# Patient Record
Sex: Female | Born: 1970 | Race: White | Hispanic: No | Smoking: Current every day smoker
Health system: Southern US, Community
[De-identification: ages and names within clinical notes are randomized; demographics above are authoritative.]

## PROBLEM LIST (undated history)

## (undated) DIAGNOSIS — B029 Zoster without complications: Secondary | ICD-10-CM

## (undated) DIAGNOSIS — R238 Other skin changes: Secondary | ICD-10-CM

## (undated) DIAGNOSIS — F32A Depression, unspecified: Secondary | ICD-10-CM

## (undated) DIAGNOSIS — F329 Major depressive disorder, single episode, unspecified: Secondary | ICD-10-CM

## (undated) DIAGNOSIS — M25559 Pain in unspecified hip: Secondary | ICD-10-CM

## (undated) DIAGNOSIS — F419 Anxiety disorder, unspecified: Secondary | ICD-10-CM

## (undated) DIAGNOSIS — E538 Deficiency of other specified B group vitamins: Secondary | ICD-10-CM

## (undated) DIAGNOSIS — D751 Secondary polycythemia: Secondary | ICD-10-CM

## (undated) DIAGNOSIS — T7840XA Allergy, unspecified, initial encounter: Secondary | ICD-10-CM

## (undated) DIAGNOSIS — K219 Gastro-esophageal reflux disease without esophagitis: Secondary | ICD-10-CM

## (undated) DIAGNOSIS — F909 Attention-deficit hyperactivity disorder, unspecified type: Secondary | ICD-10-CM

## (undated) DIAGNOSIS — J45909 Unspecified asthma, uncomplicated: Secondary | ICD-10-CM

## (undated) DIAGNOSIS — R233 Spontaneous ecchymoses: Secondary | ICD-10-CM

## (undated) HISTORY — DX: Gastro-esophageal reflux disease without esophagitis: K21.9

## (undated) HISTORY — DX: Anxiety disorder, unspecified: F41.9

## (undated) HISTORY — DX: Pain in unspecified hip: M25.559

## (undated) HISTORY — DX: Zoster without complications: B02.9

## (undated) HISTORY — PX: COLONOSCOPY: SHX174

## (undated) HISTORY — DX: Unspecified asthma, uncomplicated: J45.909

## (undated) HISTORY — PX: TUBAL LIGATION: SHX77

## (undated) HISTORY — DX: Secondary polycythemia: D75.1

## (undated) HISTORY — DX: Spontaneous ecchymoses: R23.3

## (undated) HISTORY — DX: Major depressive disorder, single episode, unspecified: F32.9

## (undated) HISTORY — DX: Depression, unspecified: F32.A

## (undated) HISTORY — DX: Other skin changes: R23.8

## (undated) HISTORY — DX: Deficiency of other specified B group vitamins: E53.8

## (undated) HISTORY — DX: Allergy, unspecified, initial encounter: T78.40XA

## (undated) HISTORY — DX: Attention-deficit hyperactivity disorder, unspecified type: F90.9

---
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
## 2000-07-17 ENCOUNTER — Other Ambulatory Visit: Admission: RE | Admit: 2000-07-17 | Discharge: 2000-07-17 | Payer: Self-pay | Admitting: Gynecology

## 2001-07-19 ENCOUNTER — Other Ambulatory Visit: Admission: RE | Admit: 2001-07-19 | Discharge: 2001-07-19 | Payer: Self-pay | Admitting: Gynecology

## 2002-07-21 ENCOUNTER — Other Ambulatory Visit: Admission: RE | Admit: 2002-07-21 | Discharge: 2002-07-21 | Payer: Self-pay | Admitting: Gynecology

## 2003-07-22 ENCOUNTER — Other Ambulatory Visit: Admission: RE | Admit: 2003-07-22 | Discharge: 2003-07-22 | Payer: Self-pay | Admitting: Gynecology

## 2003-07-30 ENCOUNTER — Encounter: Admission: RE | Admit: 2003-07-30 | Discharge: 2003-07-30 | Payer: Self-pay | Admitting: Internal Medicine

## 2004-03-06 ENCOUNTER — Inpatient Hospital Stay (HOSPITAL_COMMUNITY): Admission: AD | Admit: 2004-03-06 | Discharge: 2004-03-06 | Payer: Self-pay | Admitting: Obstetrics & Gynecology

## 2004-08-18 ENCOUNTER — Other Ambulatory Visit: Admission: RE | Admit: 2004-08-18 | Discharge: 2004-08-18 | Payer: Self-pay | Admitting: Gynecology

## 2005-05-02 ENCOUNTER — Ambulatory Visit (HOSPITAL_COMMUNITY): Admission: RE | Admit: 2005-05-02 | Discharge: 2005-05-02 | Payer: Self-pay | Admitting: Obstetrics and Gynecology

## 2005-06-28 ENCOUNTER — Inpatient Hospital Stay (HOSPITAL_COMMUNITY): Admission: AD | Admit: 2005-06-28 | Discharge: 2005-07-01 | Payer: Self-pay | Admitting: Obstetrics and Gynecology

## 2005-07-02 ENCOUNTER — Encounter: Admission: RE | Admit: 2005-07-02 | Discharge: 2005-08-01 | Payer: Self-pay | Admitting: Obstetrics and Gynecology

## 2006-09-11 ENCOUNTER — Other Ambulatory Visit: Admission: RE | Admit: 2006-09-11 | Discharge: 2006-09-11 | Payer: Self-pay | Admitting: Gynecology

## 2007-05-24 ENCOUNTER — Encounter (INDEPENDENT_AMBULATORY_CARE_PROVIDER_SITE_OTHER): Payer: Self-pay | Admitting: Obstetrics and Gynecology

## 2007-05-24 ENCOUNTER — Inpatient Hospital Stay (HOSPITAL_COMMUNITY): Admission: RE | Admit: 2007-05-24 | Discharge: 2007-05-27 | Payer: Self-pay | Admitting: Obstetrics and Gynecology

## 2007-05-27 ENCOUNTER — Encounter: Admission: RE | Admit: 2007-05-27 | Discharge: 2007-06-13 | Payer: Self-pay | Admitting: Obstetrics and Gynecology

## 2007-07-12 ENCOUNTER — Encounter: Admission: RE | Admit: 2007-07-12 | Discharge: 2007-07-12 | Payer: Self-pay | Admitting: Obstetrics and Gynecology

## 2008-02-14 ENCOUNTER — Encounter: Admission: RE | Admit: 2008-02-14 | Discharge: 2008-02-14 | Payer: Self-pay | Admitting: Obstetrics and Gynecology

## 2009-10-03 ENCOUNTER — Emergency Department (HOSPITAL_BASED_OUTPATIENT_CLINIC_OR_DEPARTMENT_OTHER)
Admission: EM | Admit: 2009-10-03 | Discharge: 2009-10-03 | Payer: Self-pay | Source: Home / Self Care | Admitting: Emergency Medicine

## 2009-10-03 ENCOUNTER — Ambulatory Visit: Payer: Self-pay | Admitting: Interventional Radiology

## 2010-07-12 NOTE — Op Note (Signed)
NAMEMORANDA, BILLIOT                 ACCOUNT NO.:  [id number]   MEDICAL RECORD NO.:  [id number]          PATIENT TYPE:  INP   LOCATION:  9199                          FACILITY:  [facility]   PHYSICIAN:  Michelle L. Grewal, M.D.DATE OF BIRTH:  1971-01-08   DATE OF PROCEDURE:  05/24/2007  DATE OF DISCHARGE:                               OPERATIVE REPORT   PREOPERATIVE DIAGNOSES:  1. Intrauterine pregnancy at 39 and 1.  2. Previous C-section.  3. Desires permanent sterilization.   POSTOPERATIVE DIAGNOSES:  1. Intrauterine pregnancy at 52 and 1.  2. Previous C-section.  3. Desires permanent sterilization.   PROCEDURE:  Repeat low transverse cesarean section and bilateral tubal  ligation.   SURGEON:  Dr. Marcelle Overlie.   ANESTHESIA:  Spinal.   FINDINGS:  Female infant, Apgars 9 at 1 minute and 9 at 5 minutes.   SPECIMENS:  Fallopian tube segment sent to pathology.   ESTIMATED BLOOD LOSS:  500 mL.   COMPLICATIONS:  None.   PROCEDURE:  The patient was taken to the operating room. Her spinal was  placed without difficulty.  She was prepped and draped in the usual  sterile fashion, Foley catheter was inserted. A low transverse incision  was made in the skin and carried down to the fascia. The fascia was  scored in the midline and extended laterally.  The rectus muscles were  separated in the midline.  The peritoneum was entered bluntly.  The  peritoneal incision was then stretched.  The lower uterine segment was  identified, the bladder flap was created.  The bladder flap was then  developed using sharp and blunt dissection. The bladder blade was  adjusted.  A low transverse incision was made in the uterus. The uterus  was entered using a hemostat.  Amniotic fluid was clear. The baby was in  cephalic presentation.  A vacuum extractor was applied to the head and  the baby was delivered easily with one gentle pull of the vacuum without  a pop off. The baby was a female infant, Apgars 9 at 1  minute and 9 at 5  minutes. It was crying before it was completely out of the abdomen.  The  baby was then handed to the awaiting pediatric team after the cord was  clamped and cut and then taken to the newborn nursery.  Pitocin and  antibiotics had been given. The placenta was manually removed and noted  to be normal, intact with three-vessel cord.  The uterus was  exteriorized.  The cavity was cleared of all clots and debris.  There  was no extension of the incision.  The incision was closed in one layer  using #0 chromic in a continuous running locked stitch.  Hemostasis was  excellent. At this point, we performed a modified Pomeroy bilateral  tubal ligation.  We then identified each tube at the fimbriated end,  grasped the mid portion of each tube with a Babcock, tied off with 3 cm  knuckle x2 and excised that knuckle using Metzenbaum scissors. Then we  were replaced the uterus to the abdomen.  The irrigation was performed.  Hemostasis was excellent.  All pedicles were inspected. The peritoneum  was closed using #0 Vicryl.  The rectus muscles were reapproximated  using the same #0 Vicryl.  The fascia was closed using #0 Vicryl in a  running stitch. After irrigation of the subcutaneous layer, a  subcuticular using 3-0 Vicryl was done to close the skin and Dermabond  skin adhesive was applied to the incision. All sponge, lap and  instrument counts were correct x2.  The patient went to the recovery  room in stable condition.      Michelle L. Vincente Poli, M.D.  Electronically Signed     MLG/MEDQ  D:  05/24/2007  T:  05/25/2007  Job:  [id number]

## 2010-07-12 NOTE — Discharge Summary (Signed)
[name], Jill Castillo                 ACCOUNT NO.:  [id number]   MEDICAL RECORD NO.:  [id number]          PATIENT TYPE:  INP   LOCATION:  9316                          [facility]ACILITY:  [facility]   PHYSICIAN:  Jill Castillo, M.D.    DATE OF BIRTH:  [birth year]   DATE OF ADMISSION:  05/24/2007  DATE OF DISCHARGE:  05/27/2007                               DISCHARGE SUMMARY   ADMITTING DIAGNOSES:  1. Intrauterine pregnancy at term.  2. Previous cesarean section, desires repeat.  3. Multiparity, desires permanent sterilization.   DISCHARGE DIAGNOSES:  1. Status post low transverse cesarean section.  2. A viable female infant.  3. Permanent sterilization.   REASON FOR ADMISSION:  Please see written H and P.   HOSPITAL COURSE:  The patient is a 40 year old gravida 3, para 1, who  presented to [facility] for scheduled cesarean section  and bilateral tubal ligation.  The patient had had a previous cesarean  delivery for breech presentation.  She desired a repeat cesarean  delivery.  The patient was also known to be Rh negative.  On admission,  vital signs were stable.  The patient was prepped for cesarean delivery  and was taken to the operating room where spinal anesthesia was  administered without difficulty.  A low transverse incision was made  with delivery of a viable female infant weighing 7 pounds 11 ounces with  Apgars of  9 at one minute and 9 at five minutes.  Bilateral tubal  ligation was performed without difficulty.  The patient tolerated the  procedure well and was taken to the recovery room in stable condition.  On postoperative day 1, the patient was without complaint.  Vital signs  remained stable.  She was afebrile.  Fundus firm and nontender.  Abdominal dressing was noted to be clean, dry, and intact.  Laboratory  findings showed hemoglobin of 12.4, platelet count 136,000, and WBC  count of 8.1.  RhoGAM studies were noted to be pending.  On  postoperative day #2, the  patient was without complaint.  Vital signs  were stable.  She was afebrile.  Fundus firm and nontender.  Abdominal  dressing had been removed revealing an incision that was clean, dry, and  intact.  RhoGAM had been administered.  On postoperative day 3, the  patient was without complaint.  She denied headache, blurred vision, or  right upper quadrant pain.  Vital signs were stable.  She was afebrile.  Blood pressure was 130/81 to 144/79.  Abdomen was soft with good return  of bowel function.  Fundus was firm and nontender.  Incision was clean,  dry, and intact with subcuticular closure noted, with some small  ecchymosis noted inferior to the incisional site.  The patient was noted  to have some small pedal edema.  Deep tendon reflexes 1+ and no clonus.  Discharge instructions were reviewed and the patient was later  discharged home.   CONDITION ON DISCHARGE:  Stable.   DIET:  Regular as tolerated.   ACTIVITY:  No heavy lifting or driving x2 weeks.  No vaginal entry.  FOLLOWUP:  The patient to follow up in the office in 1 to 2 weeks for an  incision check.  She is to call for temperature greater than 100  degrees, persistent nausea, vomiting, heavy vaginal bleeding, and/or  redness or drainage from incisional site.  The signs and symptoms of  pregnancy-induced hypertension were reviewed, and the patient is to call  for headache, blurred vision, or right upper quadrant pain.   DISCHARGE MEDICATIONS:  1. Percocet 5/325 mg, #30, one p.o. q.4-6 h. p.r.n.  2. Motrin 600 mg every 6 hours.  3. Prenatal vitamins 1 p.o. daily.  4. Colace 1 p.o. daily p.r.n.      Jill Castillo, N.P.      Jill Kid Rana Castillo, M.D.  Electronically Signed    CC/MEDQ  D:  06/12/2007  T:  06/13/2007  Job:  [id number]

## 2010-07-15 NOTE — Op Note (Signed)
Jill Castillo, Jill Castillo                 ACCOUNT NO.:  [id number]   MEDICAL RECORD NO.:  [id number]          PATIENT TYPE:  INP   LOCATION:  9128                          [facility]ILITY:  [facility]   PHYSICIAN:  Michelle L. Grewal, M.D.DATE OF BIRTH:  January 22, 1971   DATE OF PROCEDURE:  06/28/2005  DATE OF DISCHARGE:                                 OPERATIVE REPORT   PREOPERATIVE DIAGNOSES:  1.  Intrauterine pregnancy at 36 weeks and 3 days.  2.  Breech presentation.  3.  Spontaneous rupture of membranes.   POSTOPERATIVE DIAGNOSES:  1.  Intrauterine pregnancy at 36 weeks and 3 days.  2.  Breech presentation.  3.  Spontaneous rupture of membranes.   PROCEDURE:  Primary low transverse cesarean section.   SURGEON:  Michelle L. Vincente Poli, M.D.   ANESTHESIA:  Spinal.   SPECIMENS:  Female infant in frank breech presentation. Apgars 8 at one  minute, 9 at five minutes.   ESTIMATED BLOOD LOSS:  500 cc.   COMPLICATIONS:  None.   PROCEDURE:  Patient taken to the operating room. Her spinal was placed by  Dr. Pamalee Leyden without incident.  She was then prepped and draped in the usual  sterile fashion.  A Foley catheter was inserted.  A low transverse incision  was made, carried down to the fascia, the fascia scored in the midline and  extended laterally.  The rectus muscles were separated in midline, and the  peritoneum was entered bluntly.  Peritoneal incision was then stretched.  The bladder blade was inserted, and the lower uterine segment was  identified. The bladder flap was created sharply and then digitally.  The  bladder blade was then readjusted. A low transverse incision was made in the  uterus.  Amniotic fluid was clear. The baby was in frank breech presentation  and was delivered very easily without any fundal pressure whatsoever.  The  baby was a female infant. Apgars 8 at one minute and 9 at five minutes.  The  cord was clamped and cut.  Baby was handed to the waiting neonatologist and  subsequently taken to newborn nursery. Antibiotics and Pitocin were then  given. The uterus was exteriorized. It was cleared of all clots and debris.  The adnexa were normal.  The uterine incision was closed in one layers using  0 chromic in continuous running locked stitch.  It was returned to the  abdomen.  Irrigation performed.  Hemostasis was excellent.  The peritoneum  was closed using 0  Vicryl.  The fascia was also closed using 0 Vicryl continuous running stitch  x2 meeting in the midline. After irrigation of subcutaneous layer, the skin  was closed with staples.  All sponge, lap and instrument counts were correct  x2.  The patient went to recovery room in stable condition.      Michelle L. Vincente Poli, M.D.  Electronically Signed     MLG/MEDQ  D:  06/28/2005  T:  06/29/2005  Job:  [id number]

## 2010-07-15 NOTE — Discharge Summary (Signed)
NAMECHARMAN, BLASCO                 ACCOUNT NO.:  [id number]   MEDICAL RECORD NO.:  [id number]          PATIENT TYPE:  INP   LOCATION:  9128                          [facility]ACILITY:  [facility]   PHYSICIAN:  Duke Salvia. Marcelle Overlie, M.D.DATE OF BIRTH:  April 05, 1970   DATE OF ADMISSION:  06/28/2005  DATE OF DISCHARGE:  07/01/2005                                 DISCHARGE SUMMARY   ADMITTING DIAGNOSES:  1.  Intrauterine pregnancy at 36-3/7 weeks estimated gestational age.  2.  Spontaneous rupture of membranes.  3.  Breech presentation.   DISCHARGE DIAGNOSES:  1.  Status post low transverse cesarean section.  2.  Viable female infant.   PROCEDURE:  Primary low transverse cesarean.   REASON FOR ADMISSION:  Please see written H&P.   HOSPITAL COURSE:  The patient 40 year old gravida two, para 0 that presented  to [facility] at 36-3/7 weeks estimated gestational age  with spontaneous rupture of membranes.  The patient was also known to have a  breech presentation.  Confirmation of the breech presentation was confirmed.  Cervix was examined and noted to be closed.  Decision was made to proceed  with a primary low transverse cesarean section.  The patient was then  transferred to the operating room where spinal anesthesia was administered  without difficulty.  A low transverse incision was made with the delivery of  a viable female infant weighing 6 pounds 3 ounces with APGARS of 9 at one  minute and 9 at five minutes.  The patient tolerated the procedure well and  was taken to the recovery room in stable condition.   On postoperative day #1, the patient was without complaint.  Vital signs  were stable.  She was afebrile.  Abdomen soft.  Fundus firm and nontender.  Laboratory findings revealed hemoglobin of 12.8.   On postoperative day #2, the patient was without complaint.  She had good  relief of pain with use of the Motrin.  Vital signs stable.  She was  afebrile.  Abdomen soft with  good return of bowel function.  Fundus was firm  and nontender.  Abdominal dressing was noted to have a small amount of  drainage noted on the bandage.  The patient was ambulating well.  Laboratory  findings revealed hemoglobin of 12.8.  Baby's blood type was also noted to  be Rh negative and RhoGAM was not indicated.   On postoperative day #3, the patient was without complaint.  Vital signs  remained stable.  Fundus firm and nontender.  Incision was clean, dry and  intact.  Staples removed and the patient was later discharged home.   CONDITION ON DISCHARGE:  Good.   DIET:  Regular as tolerated.   ACTIVITY:  No heavy lifting, no driving x2 weeks, no vaginal entry.   FOLLOW UP:  Patient to follow up in the office in 1-2 weeks for incision  check.  She is to call for temperature greater than 100 degrees, persistent  nausea or vomiting, heavy vaginal bleeding and/or redness or drainage from  incisional site.   DISCHARGE MEDICATIONS:  1.  Tylox #30 one p.o. q.4-6h. p.r.n.  2.  Motrin 600 mg every 6 hours.  3.  Prenatal vitamins one p.o. daily.  4.  Colace one p.o. daily.      Julio Sicks, N.P.      Richard M. Marcelle Overlie, M.D.  Electronically Signed    CC/MEDQ  D:  07/19/2005  T:  07/19/2005  Job:  [id number]

## 2010-11-21 LAB — CBC
Hemoglobin: 13.7
MCHC: 34.9
MCV: 100.3 — ABNORMAL HIGH
Platelets: 147 — ABNORMAL LOW
RBC: 3.51 — ABNORMAL LOW
RBC: 3.89
RDW: 13.9
RDW: 13.9
WBC: 8.1
WBC: 8.2

## 2010-11-21 LAB — RH IMMUNE GLOB WKUP(>/=20WKS)(NOT WOMEN'S HOSP): Fetal Screen: NEGATIVE

## 2010-11-30 ENCOUNTER — Other Ambulatory Visit: Payer: Self-pay | Admitting: Obstetrics and Gynecology

## 2011-06-15 ENCOUNTER — Other Ambulatory Visit: Payer: Self-pay | Admitting: Obstetrics and Gynecology

## 2011-10-10 ENCOUNTER — Telehealth: Payer: Self-pay | Admitting: Oncology

## 2011-10-10 NOTE — Telephone Encounter (Signed)
Del.Chart to MD office °

## 2011-10-10 NOTE — Telephone Encounter (Signed)
Moved 8/14 np appt to AM. S/w pt re new time for 9:30 am.

## 2011-10-11 ENCOUNTER — Encounter: Payer: Self-pay | Admitting: Oncology

## 2011-10-11 ENCOUNTER — Other Ambulatory Visit: Payer: Self-pay | Admitting: Lab

## 2011-10-11 ENCOUNTER — Ambulatory Visit (HOSPITAL_BASED_OUTPATIENT_CLINIC_OR_DEPARTMENT_OTHER): Payer: BC Managed Care – PPO | Admitting: Oncology

## 2011-10-11 ENCOUNTER — Ambulatory Visit: Payer: Self-pay

## 2011-10-11 ENCOUNTER — Telehealth: Payer: Self-pay | Admitting: Oncology

## 2011-10-11 ENCOUNTER — Ambulatory Visit: Payer: Self-pay | Admitting: Oncology

## 2011-10-11 ENCOUNTER — Other Ambulatory Visit (HOSPITAL_BASED_OUTPATIENT_CLINIC_OR_DEPARTMENT_OTHER): Payer: BC Managed Care – PPO | Admitting: Lab

## 2011-10-11 VITALS — BP 127/73 | HR 70 | Temp 98.9°F | Resp 20 | Ht 64.0 in | Wt 133.0 lb

## 2011-10-11 DIAGNOSIS — D751 Secondary polycythemia: Secondary | ICD-10-CM

## 2011-10-11 DIAGNOSIS — F329 Major depressive disorder, single episode, unspecified: Secondary | ICD-10-CM

## 2011-10-11 DIAGNOSIS — F17200 Nicotine dependence, unspecified, uncomplicated: Secondary | ICD-10-CM

## 2011-10-11 DIAGNOSIS — M25559 Pain in unspecified hip: Secondary | ICD-10-CM

## 2011-10-11 DIAGNOSIS — Z832 Family history of diseases of the blood and blood-forming organs and certain disorders involving the immune mechanism: Secondary | ICD-10-CM

## 2011-10-11 DIAGNOSIS — R238 Other skin changes: Secondary | ICD-10-CM

## 2011-10-11 DIAGNOSIS — R233 Spontaneous ecchymoses: Secondary | ICD-10-CM | POA: Insufficient documentation

## 2011-10-11 DIAGNOSIS — F411 Generalized anxiety disorder: Secondary | ICD-10-CM

## 2011-10-11 DIAGNOSIS — I998 Other disorder of circulatory system: Secondary | ICD-10-CM

## 2011-10-11 LAB — CBC WITH DIFFERENTIAL/PLATELET
BASO%: 1.2 % (ref 0.0–2.0)
Basophils Absolute: 0.1 10*3/uL (ref 0.0–0.1)
Eosinophils Absolute: 0.1 10*3/uL (ref 0.0–0.5)
LYMPH%: 23.4 % (ref 14.0–49.7)
MCV: 97.8 fL (ref 79.5–101.0)
MONO#: 0.4 10*3/uL (ref 0.1–0.9)
NEUT%: 66.4 % (ref 38.4–76.8)
RDW: 13.7 % (ref 11.2–14.5)
lymph#: 1.4 10*3/uL (ref 0.9–3.3)

## 2011-10-11 LAB — PROTIME-INR
INR: 1 — ABNORMAL LOW (ref 2.00–3.50)
Protime: 12 Seconds (ref 10.6–13.4)

## 2011-10-11 NOTE — Progress Notes (Signed)
Patient came in today as a new patient with her mom and she has one insurance.I did explain to her our co-pay assistance and financial assistance program,she said that she should be [place] for now as far as financial assistance

## 2011-10-11 NOTE — Patient Instructions (Addendum)
1.  Easy bruisability:   - Work up:  Rule out von Willebrand disease (a common benign bleeding disorder) and other blood conditions (amyloidosis, polycythemia vera).  - If these work ups are negative, we may consider work up for platelet disorder with platelet function testing.  The problem with this testing is that it is not very specific.  Medications can interfere with the testing resulting in false positive.  And if there is history of bleeding with surgical procedures, the treatment would be just platelet transfusion.

## 2011-10-11 NOTE — Progress Notes (Signed)
[facility]  Telephone:([phone]) [phone] [phone]     INITIAL HEMATOLOGY CONSULTATION    Referral MD:  Dr. Loraine Leriche A. Perini, M.D.  Reason for Referral: easy bruisability.     HPI: Ms. Jill Castillo is a 41 year-old woman.  Before the birth of her 2nd child, she was on birth control. Therefore, she never had menometrorrhagia. However after the birth of her second child; she had tubal ligation and therefore did not take birth control. For the past few years she has been complaining of menometrorrhagia. Her menstrual period is rather regular coming once every 4 weeks. It now lasts for about 6 days. The first few days are rather heavy. She has a change in her pads about 3-4 times a day; and for her this is heavy. She noticed that for the past few years she's been developing easy bruisability on her arms abdomen and thighs. After the birth of her second child she had extensive bruising of the bilateral thighs. She didn't require any blood transfusion. She was thus kindly referred to the cancer Center for evaluation.  Jill Castillo presented to the clinic for the first time today with her mother. She reported feeling well. She has mild fatigue however she has a 20-year-old and a 6 sons.  She works full-time. She denies severe weight loss. She had lost about 10 pounds over the past few months because of a recent breakup with her boyfriend.  She is bruising on her bilateral extremities in the extensor surfaces and lower extremities. She denied any trauma to this area. She denied physical abuse. She denies any visible source of bleeding such as gum bleed, epistaxis, hematochezia, melena, hematuria.  Patient denies fever, anorexia, headache, visual changes, confusion, drenching night sweats, palpable lymph node swelling, mucositis, odynophagia, dysphagia, nausea vomiting, jaundice, chest pain, palpitation, shortness of breath, dyspnea on exertion, productive cough, gum bleeding,  epistaxis, hematemesis, hemoptysis, abdominal pain, abdominal swelling, early satiety, melena, hematochezia, hematuria, skin rash, joint swelling, joint pain, heat or cold intolerance, bowel bladder incontinence, back pain, focal motor weakness, paresthesia, depression, suicidal or homicidal ideation, feeling hopelessness.     Past Medical History  Diagnosis Date  . Depression   . Hip pain   . Asthma   . GERD (gastroesophageal reflux disease)   . Shingles   . Vitamin B12 deficiency   . Easy bruisability   . Polycythemia   :    Past Surgical History  Procedure Date  . Colonoscopy     05/2009; reportedly negative  . Wisdom tooth extraction 1986    no bleeding  . Tonsilectomry 1988    no bleeding  . Cesarean section 2007 and 2009    no bleeding; but did have brusing in 2009  :   CURRENT MEDS: Current Outpatient Prescriptions  Medication Sig Dispense Refill  . ALPRAZolam (XANAX) 0.5 MG tablet Take 0.5 mg by mouth at bedtime as needed.          Allergies no known allergies:  Family History  Problem Relation Age of Onset  . Clotting disorder Mother   . Hypertension Father   . Hyperlipidemia Father   . Heart attack Father   . Clotting disorder Maternal Grandmother   . Cancer Paternal Grandmother 37    colon cancer  :  History   Social History  . Marital Status: Divorced    Spouse Name: N/A    Number of Children: 2  . Years of Education: N/A   Occupational  History  .      [employer]; Dispensing optician   Social History Main Topics  . Smoking status: Current Everyday Smoker -- 1.0 packs/day for 25 years  . Smokeless tobacco: Not on file  . Alcohol Use: 7.2 oz/week    12 Cans of beer per week  . Drug Use: No  . Sexually Active:    Other Topics Concern  . Not on file   Social History Narrative  . No narrative on file  :  REVIEW OF SYSTEM:  The rest of the 14-point review of sytem was negative.   Exam: ECOG 0.    General:  well-nourished woman, in no acute distress.  Eyes:  no scleral icterus.  ENT:  There were no oropharyngeal lesions.  Neck was without thyromegaly.  Lymphatics:  Negative cervical, supraclavicular or axillary adenopathy.  Respiratory: lungs were clear bilaterally without wheezing or crackles.  Cardiovascular:  Regular rate and rhythm, S1/S2, without murmur, rub or gallop.  There was no pedal edema.  GI:  abdomen was soft, flat, nontender, nondistended, without organomegaly.  Muscoloskeletal:  no spinal tenderness of palpation of vertebral spine.  Skin exam was without petichae.  There were echymosis in the legs and arms.  Neuro exam was nonfocal.  Patient was able to get on and off exam table without assistance.  Gait was normal.  Patient was alerted and oriented.  Attention was good.   Language was appropriate.  Mood was normal without depression.  Speech was not pressured.  Thought content was not tangential.    LABS:  Lab Results  Component Value Date   WBC 6.2 10/11/2011   HGB 16.1* 10/11/2011   HCT 47.4* 10/11/2011   PLT 182 10/11/2011   INR 1.00* 10/11/2011    Blood smear review:   I personally reviewed the patient's peripheral blood smear today.  There was isocytosis.  There was no peripheral blast.  There was no schistocytosis, spherocytosis, target cell, rouleaux formation, tear drop cell.  There was no giant platelets or platelet clumps.      ASSESSMENT AND PLAN:   1.  History of pernicious anemia: She is on vitamin B12 injection every 3 weeks per PCP. 2.  Polycythemia:  Given her easy bruisability need to rule out polycythemia vera.  I sent for JAK-2 mutation testing today. 3.  Easy bruisability:  - Work up:  Rule out von Willebrand disease (a common benign bleeding disorder given her family history and her mother and her maternal grandmother) and other blood conditions (amyloidosis, polycythemia vera).  - If these work ups are negative, we may consider work up for  platelet disorder with platelet function testing.  The problem with this testing is that it is not very specific.  Medications can interfere with the testing resulting in false positive.  And if there is history of bleeding with surgical procedures, the treatment would be just platelet transfusion.  -  Follow up:  Repeat lab in 3 months to ensure that her polycythemia has not significantly worsended.  I will see her back in about 6 months.   4.  Smoking:  I strongly urged patient to stop smoking. She said that she is quite anxious recently because of social situation and therefore is not ready to quit at this time.   5.  Anxiety:  On Xanax prn per PCP.     Thank you for this referral.    The length of time of the face-to-face encounter was  60 minutes. More than 50% of time was spent counseling and coordination of care.

## 2011-10-11 NOTE — Telephone Encounter (Signed)
Gave pt appt schedule for November 2013 and February 2014.

## 2011-10-14 LAB — PROTEIN ELECTROPHORESIS, SERUM
Albumin ELP: 60.8 % (ref 55.8–66.1)
Alpha-1-Globulin: 7.1 % — ABNORMAL HIGH (ref 2.9–4.9)
Beta 2: 3.6 % (ref 3.2–6.5)
Beta Globulin: 6.8 % (ref 4.7–7.2)

## 2011-10-14 LAB — KAPPA/LAMBDA LIGHT CHAINS
Kappa free light chain: 1.09 mg/dL (ref 0.33–1.94)
Kappa:Lambda Ratio: 0.89 (ref 0.26–1.65)
Lambda Free Lght Chn: 1.22 mg/dL (ref 0.57–2.63)

## 2011-10-14 LAB — VON WILLEBRAND PANEL: Von Willebrand Antigen, Plasma: 137 % (ref 50–217)

## 2011-12-29 ENCOUNTER — Ambulatory Visit (INDEPENDENT_AMBULATORY_CARE_PROVIDER_SITE_OTHER): Payer: BC Managed Care – PPO | Admitting: Family Medicine

## 2011-12-29 ENCOUNTER — Ambulatory Visit: Payer: BC Managed Care – PPO

## 2011-12-29 VITALS — BP 116/72 | HR 70 | Temp 98.4°F | Resp 16 | Ht 65.0 in | Wt 132.0 lb

## 2011-12-29 DIAGNOSIS — S91109A Unspecified open wound of unspecified toe(s) without damage to nail, initial encounter: Secondary | ICD-10-CM

## 2011-12-29 DIAGNOSIS — M79676 Pain in unspecified toe(s): Secondary | ICD-10-CM

## 2011-12-29 DIAGNOSIS — S99929A Unspecified injury of unspecified foot, initial encounter: Secondary | ICD-10-CM

## 2011-12-29 DIAGNOSIS — M79609 Pain in unspecified limb: Secondary | ICD-10-CM

## 2011-12-29 DIAGNOSIS — IMO0002 Reserved for concepts with insufficient information to code with codable children: Secondary | ICD-10-CM

## 2011-12-29 DIAGNOSIS — S90456A Superficial foreign body, unspecified lesser toe(s), initial encounter: Secondary | ICD-10-CM

## 2011-12-29 MED ORDER — CEPHALEXIN 500 MG PO CAPS: 500.0000 mg | ORAL_CAPSULE | Freq: Three times a day (TID) | ORAL | Status: DC

## 2011-12-29 MED ORDER — FLUCONAZOLE 150 MG PO TABS: 150.0000 mg | ORAL_TABLET | Freq: Once | ORAL | Status: DC

## 2011-12-29 NOTE — Patient Instructions (Addendum)
Take Keflex (antibiotic) three times a day for 7 days.  Take with food.  I have sent Diflucan to your pharmacy if needed for yeast infection.

## 2011-12-29 NOTE — Progress Notes (Signed)
  Subjective:    Patient ID: Jill Castillo, female    DOB: [birth year], 41 y.o.   MRN: [id number]  HPI  Jill Castillo is a 41 yr old female here with a possible foreign body in the R great toe.  Approximately 11 days ago she was outside barefoot, did not know she had stepped on something but later felt the sensation of a splinter in the R great toe.  She attempted to get the splinter out but was unable to visualize it.  Thought it would come out on it's own while she was in the shower but it has not.  At this point she feels that maybe it's not a splinter but a piece of glass.  She states she can feel a foreign body in the toe.  It has become more painful over the last 2 days.  This morning after her shower she expressed what she describes as a lot of pus from the area but was still unable to visualize a foreign body.  Now her whole toe is sore.  No fever or chills.  Normal ROM.   State she is up to date on tetanus within the last 2 yrs    Review of Systems  All other systems reviewed and are negative.       Objective:   Physical Exam  Vitals reviewed. Constitutional: She is oriented to person, place, and time. She appears well-developed and well-nourished. No distress.  HENT:  Head: Normocephalic and atraumatic.  Musculoskeletal:       Plantar aspect of the right great toe with a small lesion, able to express some serous material; some TTP; unable to palpate a foreign body; no warmth or erythema; ROM, strength, and sensation normal  Neurological: She is alert and oriented to person, place, and time.  Skin: Skin is warm and dry.  Psychiatric: She has a normal mood and affect. Her behavior is normal.     Filed Vitals:   12/29/11 0946  BP: 116/72  Pulse: 70  Temp: 98.4 F (36.9 C)  Resp: 16      [facility] reading (PRIMARY) by  Dr. Neva Seat - approximately 3.26mm radiopaque foreign body in the right great toe   Procedure: Verbal consent obtained from the patient.  Local anesthesia obtained  with 3 cc 2% plain lidocaine and Marcaine in a 1:1 ratio.  Betadine prep.  Foreign body removed through the wound opening with splinter forceps.  Area cleaned, bandage applied.  Pt tolerated well.      Assessment & Plan:   1. Foreign body of toe    2. Wound, open, toe  Wound culture  3. Toe pain  DG Toe Great Right  4. Injury of great toe  DG Toe Great Right    Jill Castillo is a 41 yr old female with foreign body of the right great toe.  The object was visible on plain film, and it was removed here in the office.  Pt states she is utd on tetanus within the last two years.  The wound is very superficial.  I have covered it with a bandaid; I do not think it requires any special wound care.  Have placed her on Keflex x 7 days.  She will RTC if worsening or not improving, otherwise no need for follow-up.

## 2011-12-29 NOTE — Progress Notes (Signed)
Xray read and patient discussed with Ms. Egan. Agree with assessment and plan of care per her note.   

## 2012-01-02 LAB — WOUND CULTURE: Gram Stain: NONE SEEN

## 2012-01-11 ENCOUNTER — Telehealth: Payer: Self-pay | Admitting: *Deleted

## 2012-01-11 ENCOUNTER — Other Ambulatory Visit (HOSPITAL_BASED_OUTPATIENT_CLINIC_OR_DEPARTMENT_OTHER): Payer: BC Managed Care – PPO | Admitting: Lab

## 2012-01-11 DIAGNOSIS — D45 Polycythemia vera: Secondary | ICD-10-CM

## 2012-01-11 DIAGNOSIS — D751 Secondary polycythemia: Secondary | ICD-10-CM

## 2012-01-11 DIAGNOSIS — M25559 Pain in unspecified hip: Secondary | ICD-10-CM

## 2012-01-11 DIAGNOSIS — R238 Other skin changes: Secondary | ICD-10-CM

## 2012-01-11 DIAGNOSIS — F329 Major depressive disorder, single episode, unspecified: Secondary | ICD-10-CM

## 2012-01-11 LAB — CBC WITH DIFFERENTIAL/PLATELET
EOS%: 2.9 % (ref 0.0–7.0)
Eosinophils Absolute: 0.1 10*3/uL (ref 0.0–0.5)
LYMPH%: 32.2 % (ref 14.0–49.7)
MCH: 33.8 pg (ref 25.1–34.0)
MCV: 97.4 fL (ref 79.5–101.0)
MONO%: 6.3 % (ref 0.0–14.0)
NEUT#: 2.8 10*3/uL (ref 1.5–6.5)
Platelets: 219 10*3/uL (ref 145–400)
RBC: 4.3 10*6/uL (ref 3.70–5.45)

## 2012-01-11 NOTE — Telephone Encounter (Signed)
Called pt w/ CBC results,  Informed wnl and her Polycythemia resolved per Dr. Gaylyn Rong.  Keep next appt as scheduled in 3 months. She verbalized understanding.

## 2012-01-11 NOTE — Telephone Encounter (Signed)
Message copied by Wende Mott on Thu Jan 11, 2012  2:53 PM ------      Message from: HA, Raliegh Ip T      Created: Thu Jan 11, 2012  2:04 PM       Please call pt.  Her polycythemia has resolved.  (FYI, she did not have PV).  Does she still have severe bruising?   Thanks.

## 2012-04-11 ENCOUNTER — Telehealth: Payer: Self-pay | Admitting: *Deleted

## 2012-04-11 NOTE — Telephone Encounter (Signed)
Called pt re her appt w/ Dr. Gaylyn Rong tomorrow.  Pt states she has already called and left message w/ someone to cancel her appt tomorrow.  She does not want to r/s at this time.  Says she is comfortable w/ having her PCP monitoring her cbc and will call us is she needs to make another appt in the future.

## 2012-04-12 ENCOUNTER — Ambulatory Visit: Payer: BC Managed Care – PPO | Admitting: Oncology

## 2012-04-12 ENCOUNTER — Telehealth: Payer: Self-pay | Admitting: Oncology

## 2012-04-12 ENCOUNTER — Other Ambulatory Visit: Payer: BC Managed Care – PPO | Admitting: Lab

## 2012-04-12 NOTE — Telephone Encounter (Signed)
returned pt call to r/s...however pt did not want to r/s and will be going back to her primary physician

## 2012-06-19 ENCOUNTER — Other Ambulatory Visit: Payer: Self-pay | Admitting: Obstetrics and Gynecology

## 2013-02-27 HISTORY — PX: CERVICAL FUSION: SHX112

## 2013-06-20 ENCOUNTER — Other Ambulatory Visit: Payer: Self-pay | Admitting: Obstetrics and Gynecology

## 2014-06-22 ENCOUNTER — Other Ambulatory Visit: Payer: Self-pay | Admitting: Obstetrics and Gynecology

## 2014-06-24 ENCOUNTER — Other Ambulatory Visit: Payer: Self-pay | Admitting: Obstetrics and Gynecology

## 2014-06-24 DIAGNOSIS — R928 Other abnormal and inconclusive findings on diagnostic imaging of breast: Secondary | ICD-10-CM

## 2014-06-24 LAB — CYTOLOGY - PAP

## 2014-06-30 ENCOUNTER — Other Ambulatory Visit: Payer: Self-pay | Admitting: Obstetrics and Gynecology

## 2014-06-30 ENCOUNTER — Ambulatory Visit
Admission: RE | Admit: 2014-06-30 | Discharge: 2014-06-30 | Disposition: A | Discharge status: Home / Self Care | Payer: BLUE CROSS/BLUE SHIELD | Source: Ambulatory Visit | Attending: Obstetrics and Gynecology | Admitting: Obstetrics and Gynecology

## 2014-06-30 DIAGNOSIS — R928 Other abnormal and inconclusive findings on diagnostic imaging of breast: Secondary | ICD-10-CM

## 2014-07-02 ENCOUNTER — Other Ambulatory Visit: Payer: Self-pay | Admitting: Obstetrics and Gynecology

## 2014-07-02 DIAGNOSIS — R928 Other abnormal and inconclusive findings on diagnostic imaging of breast: Secondary | ICD-10-CM

## 2014-07-03 ENCOUNTER — Ambulatory Visit
Admission: RE | Admit: 2014-07-03 | Discharge: 2014-07-03 | Disposition: A | Discharge status: Home / Self Care | Payer: BLUE CROSS/BLUE SHIELD | Source: Ambulatory Visit | Attending: Obstetrics and Gynecology | Admitting: Obstetrics and Gynecology

## 2014-07-03 DIAGNOSIS — R928 Other abnormal and inconclusive findings on diagnostic imaging of breast: Secondary | ICD-10-CM

## 2014-12-25 ENCOUNTER — Other Ambulatory Visit: Payer: Self-pay | Admitting: Obstetrics and Gynecology

## 2014-12-25 DIAGNOSIS — R921 Mammographic calcification found on diagnostic imaging of breast: Secondary | ICD-10-CM

## 2015-01-14 ENCOUNTER — Ambulatory Visit
Admission: RE | Admit: 2015-01-14 | Discharge: 2015-01-14 | Disposition: A | Discharge status: Home / Self Care | Payer: BLUE CROSS/BLUE SHIELD | Source: Ambulatory Visit | Attending: Obstetrics and Gynecology | Admitting: Obstetrics and Gynecology

## 2015-01-14 ENCOUNTER — Other Ambulatory Visit: Payer: Self-pay | Admitting: Obstetrics and Gynecology

## 2015-01-14 DIAGNOSIS — R921 Mammographic calcification found on diagnostic imaging of breast: Secondary | ICD-10-CM

## 2015-02-09 ENCOUNTER — Ambulatory Visit (INDEPENDENT_AMBULATORY_CARE_PROVIDER_SITE_OTHER): Payer: BLUE CROSS/BLUE SHIELD | Admitting: Gastroenterology

## 2015-02-09 ENCOUNTER — Encounter: Payer: Self-pay | Admitting: Gastroenterology

## 2015-02-09 VITALS — BP 124/72 | HR 92 | Ht 64.0 in | Wt 115.2 lb

## 2015-02-09 DIAGNOSIS — Z800 Family history of malignant neoplasm of digestive organs: Secondary | ICD-10-CM

## 2015-02-09 MED ORDER — NA SULFATE-K SULFATE-MG SULF 17.5-3.13-1.6 GM/177ML PO SOLN: 1.0000 | Freq: Once | ORAL | Status: DC

## 2015-02-09 NOTE — Progress Notes (Signed)
Jill Castillo    [id number]    [birth year]  Primary Care Physician:PERINI,MARK A, MD  Referring Physician: Crist Infante, MD [address] [place], [place] [address]  Chief complaint:   Family history of colon cancer HPI:  44 year old female here for new patient visit to discuss colonoscopy.  Her father recently died of colon cancer at age 67, he never had a colonoscopy.  Her grandmother also had colon cancer,  Was diagnosed in her late 58s underwent surgery and she died in her late 76s. Denies any nausea, vomiting, abdominal pain, melena , constipation , diarrhea or bright red blood per rectum.    Outpatient Encounter Prescriptions as of 02/09/2015  Medication Sig  . ALPRAZolam (XANAX) 0.5 MG tablet Take 0.5 mg by mouth 2 (two) times daily as needed.   Marland Kitchen amphetamine-dextroamphetamine (ADDERALL) 20 MG tablet Take 1 tablet by mouth 3 (three) times daily.  . budesonide-formoterol (SYMBICORT) 160-4.5 MCG/ACT inhaler Inhale 2 puffs into the lungs daily.  . cyanocobalamin (,VITAMIN B-12,) 1000 MCG/ML injection Inject 1,000 mcg into the muscle. 3 times per month  . esomeprazole (NEXIUM) 40 MG capsule Take 40 mg by mouth daily at 12 noon.  Marland Kitchen PROAIR RESPICLICK 123XX123 (90 BASE) MCG/ACT AEPB Take 2 puffs by mouth as needed.  . VESICARE 5 MG tablet Take 1 tablet by mouth daily.  . [DISCONTINUED] cephALEXin (KEFLEX) 500 MG capsule Take 1 capsule (500 mg total) by mouth 3 (three) times daily.  . [DISCONTINUED] fluconazole (DIFLUCAN) 150 MG tablet Take 1 tablet (150 mg total) by mouth once. Repeat if needed   No facility-administered encounter medications on file as of 02/09/2015.    Allergies as of 02/09/2015  . (No Known Allergies)    Past Medical History  Diagnosis Date  . Depression   . Hip pain   . Asthma   . GERD (gastroesophageal reflux disease)   . Shingles   . Vitamin B12 deficiency   . Easy bruisability   . Polycythemia   . ADHD (attention deficit hyperactivity  disorder)     Past Surgical History  Procedure Laterality Date  . Colonoscopy      05/2009; reportedly negative  . Wisdom tooth extraction  1986    no bleeding  . Tonsilectomry  1988    no bleeding  . Cesarean section  2007 and 2009    no bleeding; but did have brusing in 2009  . Tubal ligation    . Cervical fusion  2015    Family History  Problem Relation Age of Onset  . Clotting disorder Mother   . Hypertension Father   . Hyperlipidemia Father   . Heart attack Father   . Clotting disorder Maternal Grandmother   . Colon cancer Paternal Grandmother 32  . Colon cancer Father   . Stroke Brother   . Hypertension Brother   . Hyperlipidemia Brother   . CAD Brother   . CAD Mother     Social History   Social History  . Marital Status: Divorced    Spouse Name: N/A  . Number of Children: 2  . Years of Education: N/A   Occupational History  .      [employer] - [employer]; Patent attorney   Social History Main Topics  . Smoking status: Current Every Day Smoker -- 1.00 packs/day for 25 years    Types: Cigarettes  . Smokeless tobacco: Never Used  . Alcohol Use: 7.2 oz/week  12 Cans of beer per week  . Drug Use: No  . Sexual Activity: Not on file   Other Topics Concern  . Not on file   Social History Narrative      Review of systems: Review of Systems  Constitutional: Negative for fever and chills.  HENT: Negative.   Eyes: Negative for blurred vision.  Respiratory: Negative for cough, shortness of breath and wheezing.   Cardiovascular: Negative for chest pain and palpitations.  Gastrointestinal: as per HPI Genitourinary: Negative for dysuria, urgency, frequency and hematuria.  Musculoskeletal: Negative for myalgias, back pain and joint pain.  Skin: Negative for itching and rash.  Neurological: Negative for dizziness, tremors, focal weakness, seizures and loss of consciousness.  Endo/Heme/Allergies: Negative for environmental  allergies.  Psychiatric/Behavioral: Negative for depression, suicidal ideas and hallucinations.  All other systems reviewed and are negative.   Physical Exam: Filed Vitals:   02/09/15 1309  BP: 124/72  Pulse: 92   Gen:      No acute distress HEENT:  EOMI, sclera anicteric Neck:     No masses; no thyromegaly Lungs:    Clear to auscultation bilaterally; normal respiratory effort CV:         Regular rate and rhythm; no murmurs Abd:      + bowel sounds; soft, non-tender; no palpable masses, no distension Ext:    No edema; adequate peripheral perfusion Skin:      Warm and dry; no rash Neuro: alert and oriented x 3 Psych: normal mood and affect  Data Reviewed:   reviewed chart and pertinent labs   Assessment and Plan/Recommendations:  44 year old female with history of colon cancer in her grandmother and father here to discuss colonoscopy we'll schedule for colonoscopy The risks and benefits as well as alternatives of endoscopic procedure(s) have been discussed and reviewed. All questions answered. The patient agrees to proceed. [name] , MD [phone] Mon-Fri 8a-5p [phone] after 5p, weekends, holidays

## 2015-02-09 NOTE — Patient Instructions (Signed)

## 2015-04-01 ENCOUNTER — Encounter: Payer: BLUE CROSS/BLUE SHIELD | Admitting: Gastroenterology

## 2015-06-17 ENCOUNTER — Other Ambulatory Visit: Payer: Self-pay | Admitting: Obstetrics and Gynecology

## 2015-06-17 DIAGNOSIS — R921 Mammographic calcification found on diagnostic imaging of breast: Secondary | ICD-10-CM

## 2015-07-15 ENCOUNTER — Ambulatory Visit
Admission: RE | Admit: 2015-07-15 | Discharge: 2015-07-15 | Disposition: A | Discharge status: Home / Self Care | Payer: BLUE CROSS/BLUE SHIELD | Source: Ambulatory Visit | Attending: Obstetrics and Gynecology | Admitting: Obstetrics and Gynecology

## 2015-07-15 ENCOUNTER — Other Ambulatory Visit: Payer: Self-pay | Admitting: Obstetrics and Gynecology

## 2015-07-15 DIAGNOSIS — R921 Mammographic calcification found on diagnostic imaging of breast: Secondary | ICD-10-CM

## 2016-05-04 ENCOUNTER — Encounter: Payer: Self-pay | Admitting: Internal Medicine

## 2016-06-09 ENCOUNTER — Other Ambulatory Visit: Payer: Self-pay | Admitting: Obstetrics and Gynecology

## 2016-06-09 DIAGNOSIS — R921 Mammographic calcification found on diagnostic imaging of breast: Secondary | ICD-10-CM

## 2016-07-17 ENCOUNTER — Ambulatory Visit
Admission: RE | Admit: 2016-07-17 | Discharge: 2016-07-17 | Disposition: A | Discharge status: Home / Self Care | Payer: 59 | Source: Ambulatory Visit | Attending: Obstetrics and Gynecology | Admitting: Obstetrics and Gynecology

## 2016-07-17 DIAGNOSIS — R921 Mammographic calcification found on diagnostic imaging of breast: Secondary | ICD-10-CM

## 2017-03-15 IMAGING — MG MM DIAG BREAST TOMO UNI RIGHT
7 series · 8 of 15 positions shown · non-contrast
Comparison: Previous exams including diagnostic exam dated
06/30/2014 and preprocedure stereotactic images dated 07/03/2014.

CLINICAL DATA: Six-month follow-up for probably benign right breast
calcifications.

On preprocedure imaging for stereotactic biopsy in Saturday June, 2014, the
calcifications appeared to layer suggesting benign milk of calcium.
EXAM:
DIGITAL DIAGNOSTIC RIGHT MAMMOGRAM WITH 3D TOMOSYNTHESIS AND CAD

[R MLO (1 of 2)]
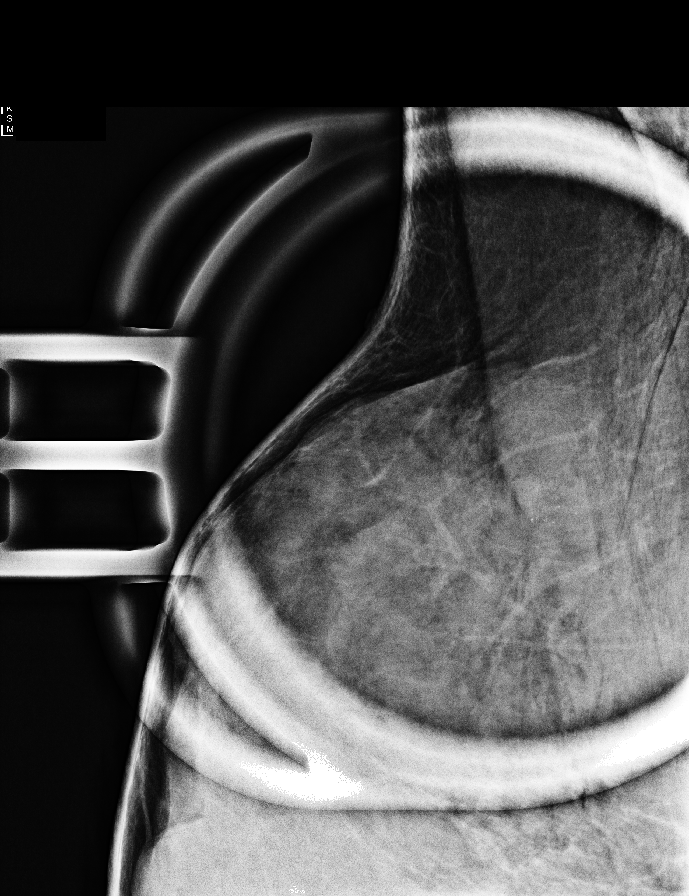

[R CC (1 of 2)]
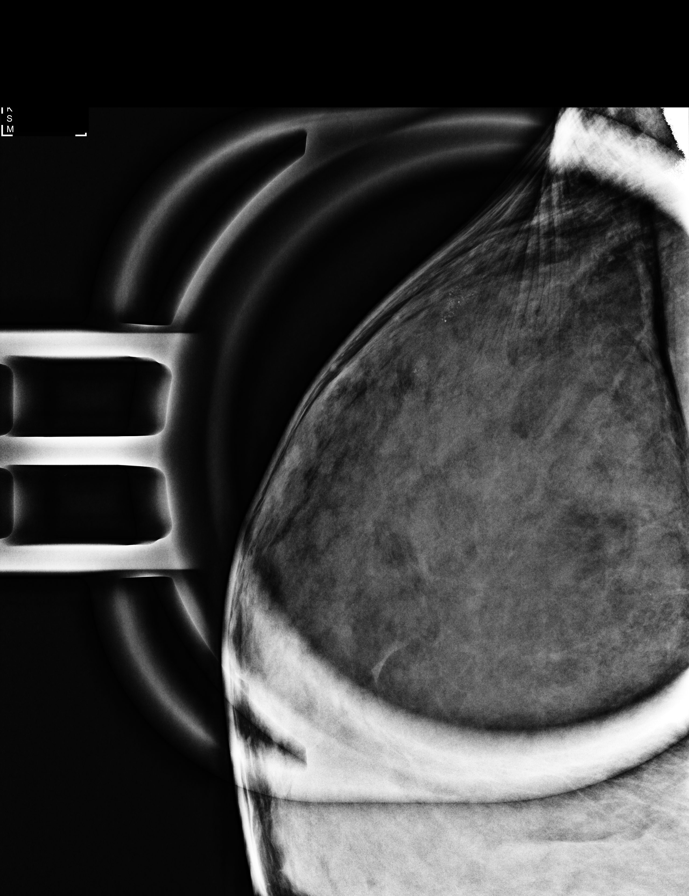

[R ML]
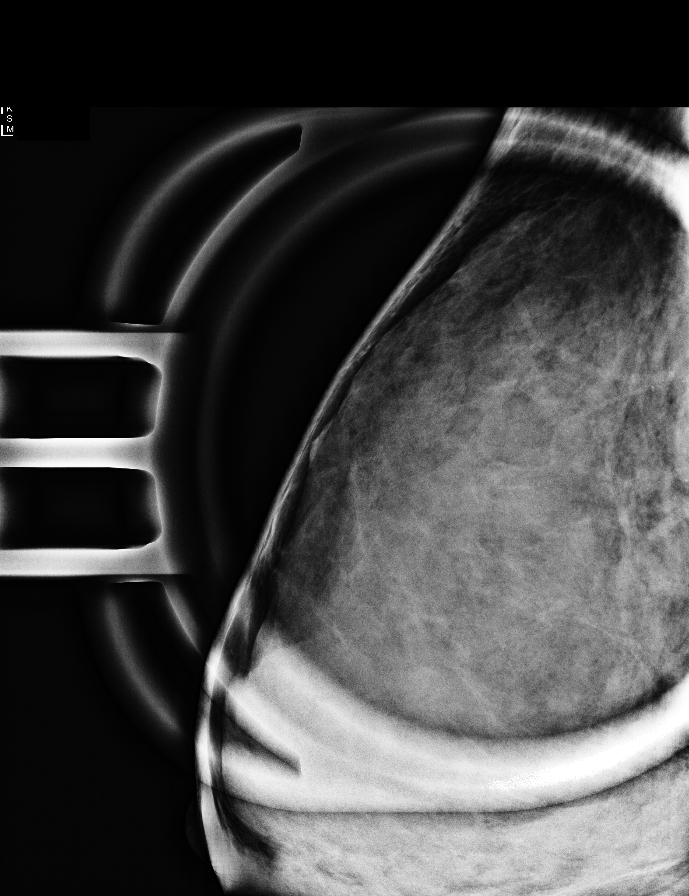

[R CC (2 of 2)]
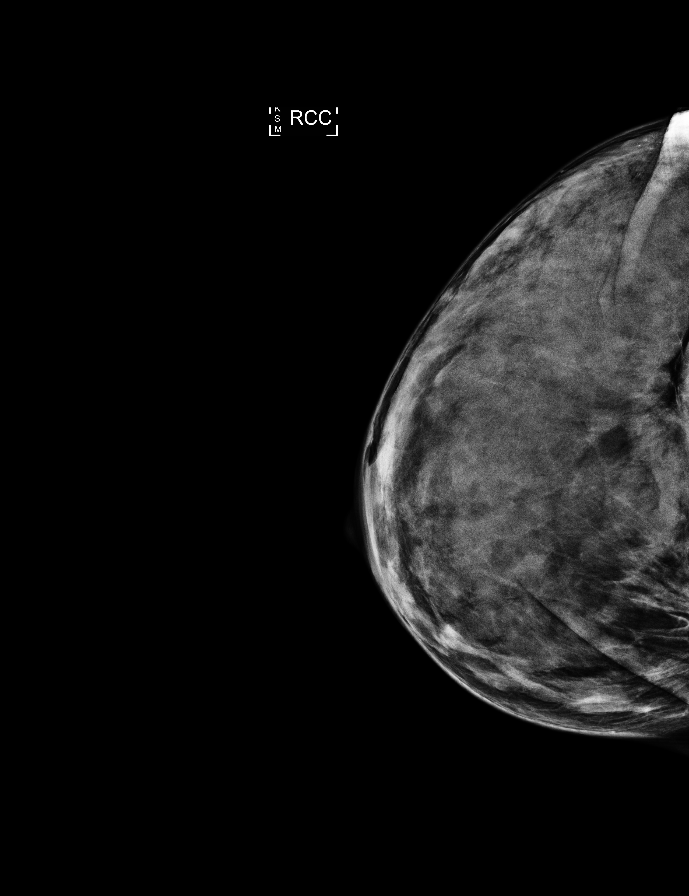

[R MLO (2 of 2)]
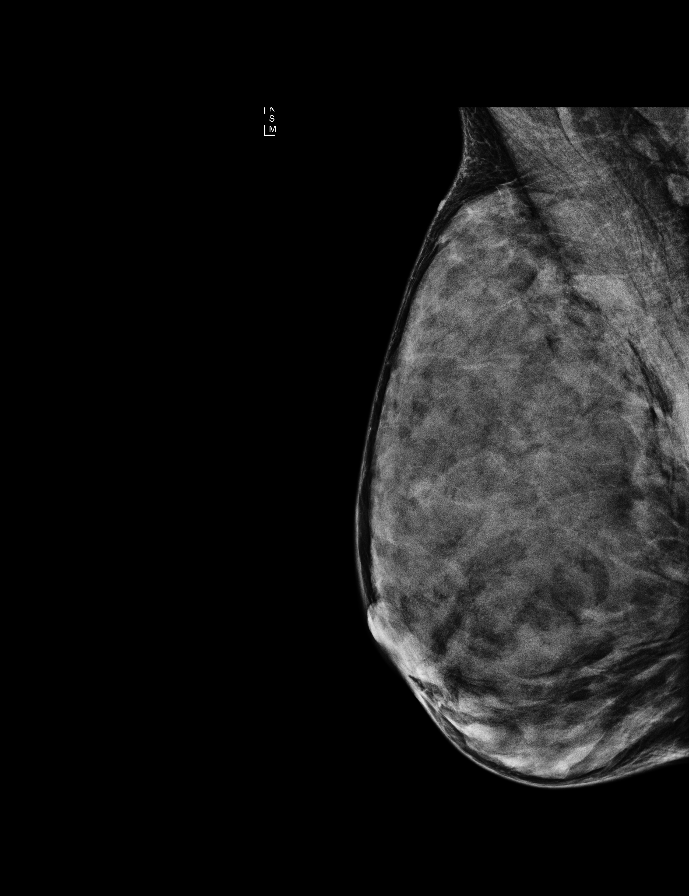

[R MLO tomo · 2 of 41 frames shown]
[frame 14/41]
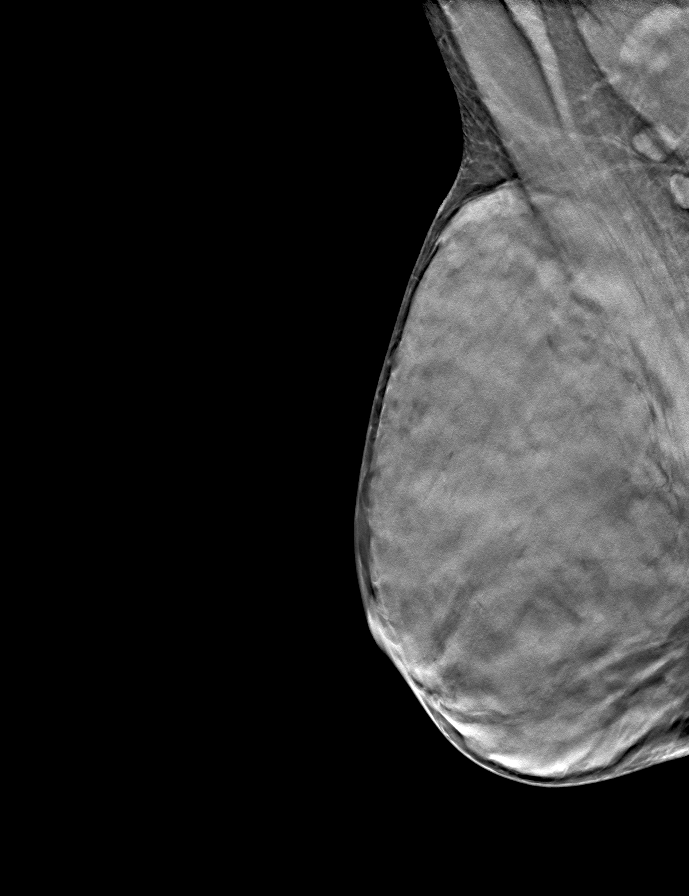
[frame 21/41]
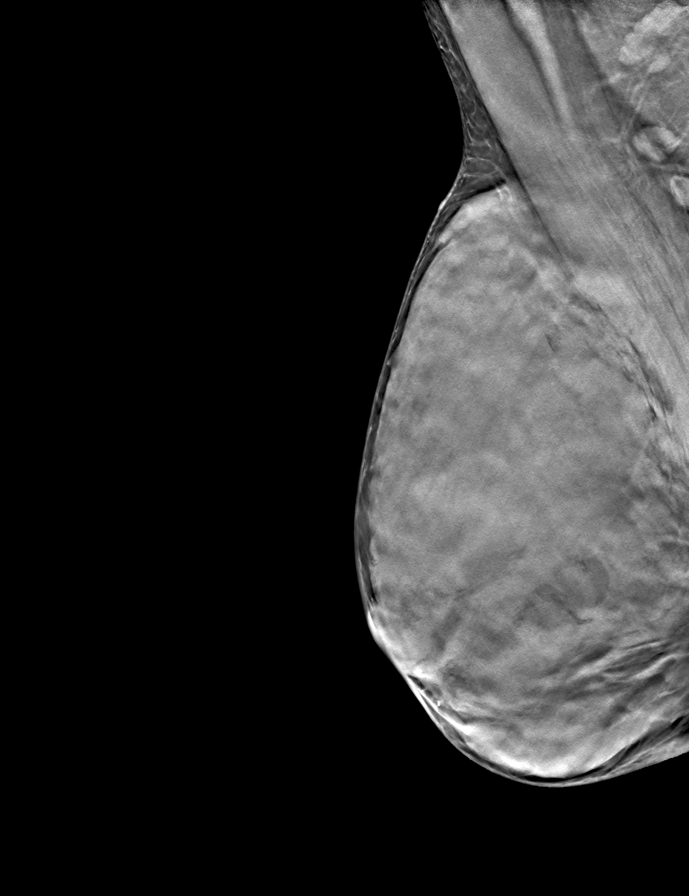

[R CC tomo · tomo slice 23/44.0]
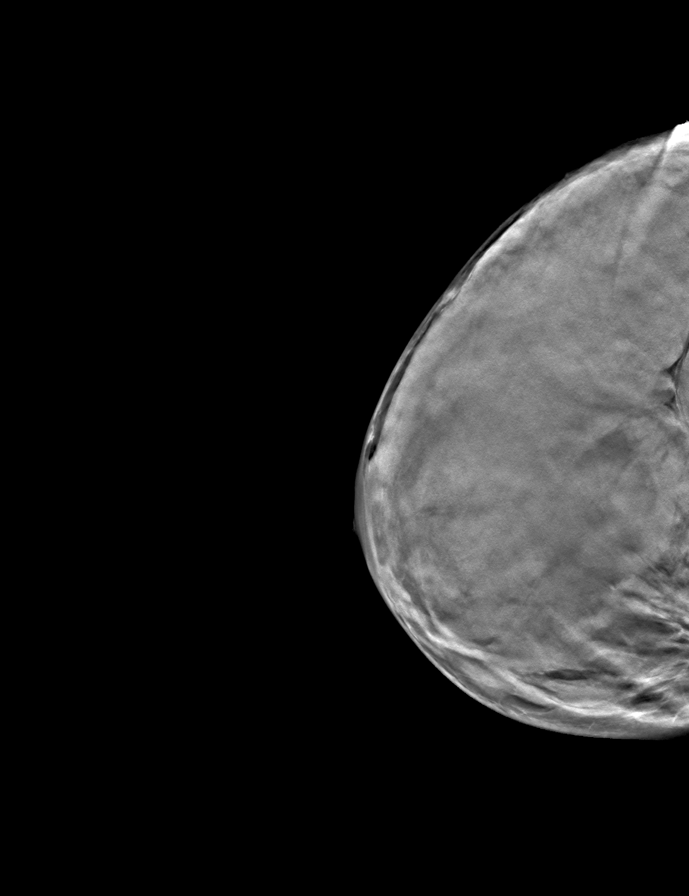

[8 of 15 positions shown; findings below may reference images not displayed]

ACR Breast Density Category d: The breast tissue is extremely dense,
which lowers the sensitivity of mammography.
FINDINGS: Right breast CC and MLO views were obtained with 3D tomosynthesis.
Additional magnification views were obtained for the right breast
calcifications in the upper-outer quadrant.

Mammographic images were processed with CAD.
IMPRESSION: The grouped punctate calcifications within the the upper-outer
quadrant of the right breast, at posterior depth, are unchanged in
number or extent. They do not convincingly layer on today's MLO
projection and are inadequately visualized on today's true lateral
projection. I agree that these calcifications had a layering
appearance on the earlier preprocedure stereotactic images.
Recommend additional six-month follow-up diagnostic mammogram to
ensure continued stability.

There are no new dominant masses, suspicious calcifications or
secondary signs of malignancy within the right breast.

RECOMMENDATION:
Follow-up right breast diagnostic mammogram, with magnification
views, in 6 months.

This will be performed as a bilateral diagnostic mammogram in
conjunction with patient's routine left breast screening mammogram
schedule.

I have discussed the findings and recommendations with the patient.
Results were also provided in writing at the conclusion of the
visit. If applicable, a reminder letter will be sent to the patient
regarding the next appointment.

BI-RADS CATEGORY  3: Probably benign.

## 2017-07-13 ENCOUNTER — Ambulatory Visit: Payer: 59 | Admitting: Gastroenterology

## 2017-07-16 ENCOUNTER — Ambulatory Visit: Payer: 59 | Admitting: Gastroenterology

## 2017-07-19 ENCOUNTER — Ambulatory Visit: Payer: 59 | Admitting: Gastroenterology

## 2017-07-19 ENCOUNTER — Encounter: Payer: Self-pay | Admitting: Gastroenterology

## 2017-07-19 VITALS — BP 124/70 | HR 82 | Ht 64.0 in | Wt 130.2 lb

## 2017-07-19 DIAGNOSIS — Z800 Family history of malignant neoplasm of digestive organs: Secondary | ICD-10-CM | POA: Diagnosis not present

## 2017-07-19 DIAGNOSIS — R197 Diarrhea, unspecified: Secondary | ICD-10-CM

## 2017-07-19 MED ORDER — COLESTIPOL HCL 1 G PO TABS: 2.0000 g | ORAL_TABLET | Freq: Two times a day (BID) | ORAL | 3 refills | Status: DC

## 2017-07-19 MED ORDER — NA SULFATE-K SULFATE-MG SULF 17.5-3.13-1.6 GM/177ML PO SOLN: 1.0000 | Freq: Once | ORAL | 0 refills | Status: AC

## 2017-07-19 NOTE — Progress Notes (Signed)
Jill Castillo    [id number]    01/19/1971  Primary Care Physician:Perini, Elta Guadeloupe, MD  Referring Physician: Crist Infante, [place], [place] [address]  Chief complaint: Family history of colon cancer   HPI:  47 year old female with family history of colon cancer in her father and also grandmother is here to discuss colorectal cancer screening.  Patient was last seen in office in December 2016 and was scheduled for colonoscopy at the time, but her insurance denied procedure and she canceled it.  She has new insurance and would like to undergo colonoscopy for colorectal cancer screening.  Denies any nausea, vomiting, abdominal pain, melena or bright red blood per rectum Review of system patient complaining of increased bowel frequency and urgency intermittently.  She drinks wine almost daily.   Outpatient Encounter Medications as of 07/19/2017  Medication Sig  . ALPRAZolam (XANAX) 0.5 MG tablet Take 0.5 mg by mouth 2 (two) times daily as needed.   Marland Kitchen amphetamine-dextroamphetamine (ADDERALL) 20 MG tablet Take 1 tablet by mouth 3 (three) times daily.  . budesonide-formoterol (SYMBICORT) 160-4.5 MCG/ACT inhaler Inhale 2 puffs into the lungs daily.  . cyanocobalamin (,VITAMIN B-12,) 1000 MCG/ML injection Inject 1,000 mcg into the muscle. 3 times per month  . esomeprazole (NEXIUM) 40 MG capsule Take 40 mg by mouth daily at 12 noon.  Marland Kitchen PROAIR RESPICLICK 373 (90 BASE) MCG/ACT AEPB Take 2 puffs by mouth as needed.  . [DISCONTINUED] Na Sulfate-K Sulfate-Mg Sulf (SUPREP BOWEL PREP) SOLN Take 1 kit by mouth once.  . [DISCONTINUED] VESICARE 5 MG tablet Take 1 tablet by mouth daily.   No facility-administered encounter medications on file as of 07/19/2017.     Allergies as of 07/19/2017  . (No Known Allergies)    Past Medical History:  Diagnosis Date  . ADHD (attention deficit hyperactivity disorder)   . Asthma   . Depression   . Easy bruisability   . GERD  (gastroesophageal reflux disease)   . Hip pain   . Polycythemia   . Shingles   . Vitamin B12 deficiency     Past Surgical History:  Procedure Laterality Date  . CERVICAL FUSION  2015  . CESAREAN SECTION  2007 and 2009   no bleeding; but did have brusing in 2009  . COLONOSCOPY     05/2009; reportedly negative  . tonsilectomry  1988   no bleeding  . TUBAL LIGATION    . WISDOM TOOTH EXTRACTION  1986   no bleeding    Family History  Problem Relation Age of Onset  . Clotting disorder Mother   . CAD Mother   . Hypertension Father   . Hyperlipidemia Father   . Heart attack Father   . Colon cancer Father   . Clotting disorder Maternal Grandmother   . Colon cancer Paternal Grandmother 42  . Stroke Brother   . Hypertension Brother   . Hyperlipidemia Brother   . CAD Brother     Social History   Socioeconomic History  . Marital status: Divorced    Spouse name: Not on file  . Number of children: 2  . Years of education: Not on file  . Highest education level: Not on file  Occupational History    Comment: [employer]; Patent attorney  Social Needs  . Financial resource strain: Not on file  . Food insecurity:    Worry: Not on file    Inability: Not  on file  . Transportation needs:    Medical: Not on file    Non-medical: Not on file  Tobacco Use  . Smoking status: Current Every Day Smoker    Packs/day: 1.00    Years: 25.00    Pack years: 25.00    Types: Cigarettes  . Smokeless tobacco: Never Used  Substance and Sexual Activity  . Alcohol use: Yes    Alcohol/week: 7.2 oz    Types: 12 Cans of beer per week  . Drug use: No  . Sexual activity: Not on file  Lifestyle  . Physical activity:    Days per week: Not on file    Minutes per session: Not on file  . Stress: Not on file  Relationships  . Social connections:    Talks on phone: Not on file    Gets together: Not on file    Attends religious service: Not on file    Active  member of club or organization: Not on file    Attends meetings of clubs or organizations: Not on file    Relationship status: Not on file  . Intimate partner violence:    Fear of current or ex partner: Not on file    Emotionally abused: Not on file    Physically abused: Not on file    Forced sexual activity: Not on file  Other Topics Concern  . Not on file  Social History Narrative  . Not on file      Review of systems: Review of Systems  Constitutional: Negative for fever and chills.  HENT: Negative.   Eyes: Negative for blurred vision.  Respiratory: Negative for cough, shortness of breath and wheezing.   Cardiovascular: Negative for chest pain and palpitations.  Gastrointestinal: as per HPI Genitourinary: Negative for dysuria, urgency, frequency and hematuria.  Musculoskeletal: Positive for myalgias, back pain and joint pain.  Skin: Negative for itching and rash.  Neurological: Negative for dizziness, tremors, focal weakness, seizures and loss of consciousness.  Endo/Heme/Allergies: Positive for seasonal allergies.  Psychiatric/Behavioral: Negative for depression, suicidal ideas and hallucinations.  All other systems reviewed and are negative.   Physical Exam: Vitals:   07/19/17 1426  BP: 124/70  Pulse: 82   Body mass index is 22.36 kg/m. Gen:      No acute distress HEENT:  EOMI, sclera anicteric Neck:     No masses; no thyromegaly Lungs:    Clear to auscultation bilaterally; normal respiratory effort CV:         Regular rate and rhythm; no murmurs Abd:      + bowel sounds; soft, non-tender; no palpable masses, no distension Ext:    No edema; adequate peripheral perfusion Skin:      Warm and dry; no rash Neuro: alert and oriented x 3 Psych: normal mood and affect  Data Reviewed:  Reviewed labs, radiology imaging, old records and pertinent past GI work up   Assessment and Plan/Recommendations:  47 year old female with family history of colon cancer in  paternal grandmother and father Increased risk due for colonoscopy for colorectal cancer screening The risks and benefits as well as alternatives of endoscopic procedure(s) have been discussed and reviewed. All questions answered. The patient agrees to proceed. Intermittent diarrhea could be dietary related Advised patient to do a trial of lactose-free diet for 1 to 2 weeks Avoid artificial sweeteners, soda, high fructose corn syrup Limit intake of alcohol to 1 glass a day of wine Colestid 1 g twice daily for possible bile salt  induced diarrhea  Return as needed after the procedure 25 minutes was spent face-to-face with the patient. Greater than 50% of the time used for counseling as well as treatment plan and follow-up. She had multiple questions which were answered to her satisfaction  K. Denzil Magnuson , MD [phone]    CC: Crist Infante, MD

## 2017-07-19 NOTE — Patient Instructions (Addendum)
You have been scheduled for a colonoscopy. Please follow written instructions given to you at your visit today.  Please pick up your prep supplies at the pharmacy within the next 1-3 days. If you use inhalers (even only as needed), please bring them with you on the day of your procedure. Your physician has requested that you go to www.startemmi.com and enter the access code given to you at your visit today. This web site gives a general overview about your procedure. However, you should still follow specific instructions given to you by our office regarding your preparation for the procedure.  We have sent Colestid to your pharmacy  We will obtain your labs from Dr Perini's office  Avoid artificial sweeteners   Follow up as needed  If you are age 60 or older, your body mass index should be between 23-30. Your Body mass index is 22.36 kg/m. If this is out of the aforementioned range listed, please consider follow up with your Primary Care Provider.  If you are age 55 or younger, your body mass index should be between 19-25. Your Body mass index is 22.36 kg/m. If this is out of the aformentioned range listed, please consider follow up with your Primary Care Provider.

## 2017-07-27 ENCOUNTER — Encounter: Payer: Self-pay | Admitting: Gastroenterology

## 2017-08-13 ENCOUNTER — Encounter: Payer: 59 | Admitting: Gastroenterology

## 2017-10-09 ENCOUNTER — Other Ambulatory Visit: Payer: Self-pay | Admitting: Obstetrics and Gynecology

## 2017-10-09 DIAGNOSIS — N6002 Solitary cyst of left breast: Secondary | ICD-10-CM

## 2017-10-10 ENCOUNTER — Ambulatory Visit
Admission: RE | Admit: 2017-10-10 | Discharge: 2017-10-10 | Disposition: A | Discharge status: Home / Self Care | Payer: 59 | Source: Ambulatory Visit | Attending: Obstetrics and Gynecology | Admitting: Obstetrics and Gynecology

## 2017-10-10 DIAGNOSIS — N6002 Solitary cyst of left breast: Secondary | ICD-10-CM

## 2019-06-03 ENCOUNTER — Other Ambulatory Visit: Payer: Self-pay | Admitting: Internal Medicine

## 2019-06-03 DIAGNOSIS — E785 Hyperlipidemia, unspecified: Secondary | ICD-10-CM

## 2019-09-10 ENCOUNTER — Other Ambulatory Visit: Payer: Self-pay | Admitting: Obstetrics and Gynecology

## 2019-09-10 DIAGNOSIS — R928 Other abnormal and inconclusive findings on diagnostic imaging of breast: Secondary | ICD-10-CM

## 2020-06-04 ENCOUNTER — Other Ambulatory Visit: Payer: Self-pay | Admitting: Internal Medicine

## 2020-06-04 DIAGNOSIS — E785 Hyperlipidemia, unspecified: Secondary | ICD-10-CM

## 2021-06-28 ENCOUNTER — Encounter: Payer: Self-pay | Admitting: Gastroenterology

## 2021-06-29 ENCOUNTER — Other Ambulatory Visit: Payer: Self-pay | Admitting: Internal Medicine

## 2021-06-29 DIAGNOSIS — Z720 Tobacco use: Secondary | ICD-10-CM

## 2021-07-07 ENCOUNTER — Other Ambulatory Visit: Payer: Self-pay | Admitting: Obstetrics and Gynecology

## 2021-07-07 DIAGNOSIS — Z1231 Encounter for screening mammogram for malignant neoplasm of breast: Secondary | ICD-10-CM

## 2021-07-13 ENCOUNTER — Ambulatory Visit
Admission: RE | Admit: 2021-07-13 | Discharge: 2021-07-13 | Disposition: A | Discharge status: Home / Self Care | Payer: 59 | Source: Ambulatory Visit | Attending: Obstetrics and Gynecology | Admitting: Obstetrics and Gynecology

## 2021-07-13 DIAGNOSIS — Z1231 Encounter for screening mammogram for malignant neoplasm of breast: Secondary | ICD-10-CM

## 2021-07-15 ENCOUNTER — Other Ambulatory Visit: Payer: Self-pay | Admitting: Obstetrics and Gynecology

## 2021-07-15 DIAGNOSIS — R928 Other abnormal and inconclusive findings on diagnostic imaging of breast: Secondary | ICD-10-CM

## 2021-07-18 ENCOUNTER — Other Ambulatory Visit: Payer: Self-pay

## 2021-07-18 ENCOUNTER — Ambulatory Visit (AMBULATORY_SURGERY_CENTER): Payer: Self-pay

## 2021-07-18 VITALS — Ht 64.0 in | Wt 146.0 lb

## 2021-07-18 DIAGNOSIS — Z1211 Encounter for screening for malignant neoplasm of colon: Secondary | ICD-10-CM

## 2021-07-18 MED ORDER — NA SULFATE-K SULFATE-MG SULF 17.5-3.13-1.6 GM/177ML PO SOLN: 1.0000 | Freq: Once | ORAL | 0 refills | Status: AC

## 2021-07-18 NOTE — Progress Notes (Signed)
Denies allergies to eggs or soy products. Denies complication of anesthesia or sedation. Denies use of weight loss medication. Denies use of O2.   Emmi instructions given for colonoscopy.  

## 2021-07-26 ENCOUNTER — Inpatient Hospital Stay: Admission: RE | Admit: 2021-07-26 | Payer: Self-pay | Source: Ambulatory Visit

## 2021-07-27 ENCOUNTER — Other Ambulatory Visit: Payer: Self-pay | Admitting: Obstetrics and Gynecology

## 2021-07-27 ENCOUNTER — Ambulatory Visit
Admission: RE | Admit: 2021-07-27 | Discharge: 2021-07-27 | Disposition: A | Discharge status: Home / Self Care | Payer: 59 | Source: Ambulatory Visit | Attending: Obstetrics and Gynecology | Admitting: Obstetrics and Gynecology

## 2021-07-27 DIAGNOSIS — R928 Other abnormal and inconclusive findings on diagnostic imaging of breast: Secondary | ICD-10-CM

## 2021-07-27 DIAGNOSIS — N6489 Other specified disorders of breast: Secondary | ICD-10-CM

## 2021-08-01 ENCOUNTER — Ambulatory Visit
Admission: RE | Admit: 2021-08-01 | Discharge: 2021-08-01 | Disposition: A | Discharge status: Home / Self Care | Payer: 59 | Source: Ambulatory Visit | Attending: Obstetrics and Gynecology | Admitting: Obstetrics and Gynecology

## 2021-08-01 DIAGNOSIS — N6489 Other specified disorders of breast: Secondary | ICD-10-CM

## 2021-08-11 ENCOUNTER — Encounter: Payer: Self-pay | Admitting: Gastroenterology

## 2021-08-15 ENCOUNTER — Encounter: Payer: Self-pay | Admitting: Gastroenterology

## 2021-08-15 ENCOUNTER — Ambulatory Visit (AMBULATORY_SURGERY_CENTER): Payer: 59 | Admitting: Gastroenterology

## 2021-08-15 VITALS — BP 108/77 | HR 78 | Temp 98.2°F | Resp 15 | Ht 64.25 in | Wt 146.0 lb

## 2021-08-15 DIAGNOSIS — Z1211 Encounter for screening for malignant neoplasm of colon: Secondary | ICD-10-CM

## 2021-08-15 DIAGNOSIS — D123 Benign neoplasm of transverse colon: Secondary | ICD-10-CM | POA: Diagnosis not present

## 2021-08-15 DIAGNOSIS — D122 Benign neoplasm of ascending colon: Secondary | ICD-10-CM

## 2021-08-15 DIAGNOSIS — D125 Benign neoplasm of sigmoid colon: Secondary | ICD-10-CM

## 2021-08-15 MED ORDER — SODIUM CHLORIDE 0.9 % IV SOLN: 500.0000 mL | Freq: Once | INTRAVENOUS | Status: DC

## 2021-08-15 NOTE — Op Note (Signed)
[facility] Patient Name: Jill Castillo Procedure Date: 08/15/2021 3:10 PM MRN: [id number] Endoscopist: Mauri Pole , MD Age: 51 Referring MD:  Date of Birth: [birth year] Gender: Female Account #: [id number] Procedure:                Colonoscopy Indications:              Screening in patient at increased risk: Family                            history of 1st-degree relative with colorectal                            cancer Medicines:                Monitored Anesthesia Care Procedure:                Pre-Anesthesia Assessment:                           - Prior to the procedure, a History and Physical                            was performed, and patient medications and                            allergies were reviewed. The patient's tolerance of                            previous anesthesia was also reviewed. The risks                            and benefits of the procedure and the sedation                            options and risks were discussed with the patient.                            All questions were answered, and informed consent                            was obtained. Prior Anticoagulants: The patient has                            taken no previous anticoagulant or antiplatelet                            agents. ASA Grade Assessment: II - A patient with                            mild systemic disease. After reviewing the risks                            and benefits, the patient was deemed in  satisfactory condition to undergo the procedure.                           After obtaining informed consent, the colonoscope                            was passed under direct vision. Throughout the                            procedure, the patient's blood pressure, pulse, and                            oxygen saturations were monitored continuously. The                            0405 PCF-H190TL Slim SB Colonoscope was introduced                             through the anus and advanced to the the cecum,                            identified by appendiceal orifice and ileocecal                            valve. The colonoscopy was performed without                            difficulty. The patient tolerated the procedure                            well. The quality of the bowel preparation was                            good. The ileocecal valve, appendiceal orifice, and                            rectum were photographed. Scope In: 3:40:00 PM Scope Out: 4:01:36 PM Scope Withdrawal Time: 0 hours 12 minutes 15 seconds  Total Procedure Duration: 0 hours 21 minutes 36 seconds  Findings:                 The perianal and digital rectal examinations were                            normal.                           Five sessile polyps were found in the sigmoid                            colon, transverse colon and ascending colon. The                            polyps were 4 to 9 mm in size. These polyps were  removed with a hot snare. Resection and retrieval                            were complete. These polyps were removed with a                            cold snare. Resection and retrieval were complete.                           Non-bleeding external and internal hemorrhoids were                            found during retroflexion. The hemorrhoids were                            medium-sized. Complications:            No immediate complications. Estimated Blood Loss:     Estimated blood loss was minimal. Impression:               - Five 4 to 9 mm polyps in the sigmoid colon, in                            the transverse colon and in the ascending colon,                            removed with a hot snare and removed with a cold                            snare. Resected and retrieved.                           - Non-bleeding external and internal hemorrhoids. Recommendation:           -  Patient has a contact number available for                            emergencies. The signs and symptoms of potential                            delayed complications were discussed with the                            patient. Return to normal activities tomorrow.                            Written discharge instructions were provided to the                            patient.                           - Resume previous diet.                           - Continue present  medications.                           - Await pathology results.                           - Repeat colonoscopy in 3 - 5 years for                            surveillance based on pathology results. Mauri Pole, MD 08/15/2021 4:15:33 PM This report has been signed electronically.

## 2021-08-15 NOTE — Progress Notes (Unsigned)
[facility] History and Physical   Primary Care Physician:  Crist Infante, MD   Reason for Procedure:  Family history of colon cancer  Plan:    Screening colonoscopy with possible interventions as needed     HPI: Jill Castillo is a very pleasant 51 y.o. female here for screening colonoscopy. Denies any nausea, vomiting, abdominal pain, melena or bright red blood per rectum  The risks and benefits as well as alternatives of endoscopic procedure(s) have been discussed and reviewed. All questions answered. The patient agrees to proceed.    Past Medical History:  Diagnosis Date   ADHD (attention deficit hyperactivity disorder)    Allergy    Anxiety    Asthma    Depression    Easy bruisability    GERD (gastroesophageal reflux disease)    Hip pain    Polycythemia    Shingles    Vitamin B12 deficiency     Past Surgical History:  Procedure Laterality Date   CERVICAL FUSION  2015   CESAREAN SECTION  2007 and 2009   no bleeding; but did have brusing in 2009   COLONOSCOPY     05/2009; reportedly negative   tonsilectomry  1988   no bleeding   TUBAL LIGATION     [place]   no bleeding    Prior to Admission medications   Medication Sig Start Date End Date Taking? Authorizing Provider  ALPRAZolam Duanne Moron) 0.5 MG tablet Take 0.5 mg by mouth 2 (two) times daily as needed.     [provider]  amphetamine-dextroamphetamine (ADDERALL) 20 MG tablet Take 1 tablet by mouth 3 (three) times daily. 12/05/14   [provider]  budesonide-formoterol (SYMBICORT) 160-4.5 MCG/ACT inhaler Inhale 2 puffs into the lungs daily.    [provider]  cyanocobalamin (,VITAMIN B-12,) 1000 MCG/ML injection Inject 1,000 mcg into the muscle. 3 times per month    [provider]  esomeprazole (NEXIUM) 40 MG capsule Take 40 mg by mouth daily at 12 noon.    [provider]  PROAIR RESPICLICK 778 (90 BASE) MCG/ACT AEPB Take 2 puffs  by mouth as needed. 01/28/15   [provider]  progesterone (PROMETRIUM) 100 MG capsule Take 100 mg by mouth 3 (three) times daily.    [provider]    Current Outpatient Medications  Medication Sig Dispense Refill   ALPRAZolam (XANAX) 0.5 MG tablet Take 0.5 mg by mouth 2 (two) times daily as needed.      amphetamine-dextroamphetamine (ADDERALL) 20 MG tablet Take 1 tablet by mouth 3 (three) times daily.     budesonide-formoterol (SYMBICORT) 160-4.5 MCG/ACT inhaler Inhale 2 puffs into the lungs daily.     cyanocobalamin (,VITAMIN B-12,) 1000 MCG/ML injection Inject 1,000 mcg into the muscle. 3 times per month     esomeprazole (NEXIUM) 40 MG capsule Take 40 mg by mouth daily at 12 noon.     PROAIR RESPICLICK 242 (90 BASE) MCG/ACT AEPB Take 2 puffs by mouth as needed.     progesterone (PROMETRIUM) 100 MG capsule Take 100 mg by mouth 3 (three) times daily.     No current facility-administered medications for this visit.    Allergies as of 08/15/2021   (No Known Allergies)    Family History  Problem Relation Age of Onset   Clotting disorder Mother    CAD Mother    Hypertension Father    Hyperlipidemia Father    Heart attack Father    Colon cancer  Father    Stroke Brother    Hypertension Brother    Hyperlipidemia Brother    CAD Brother    Clotting disorder Maternal Grandmother    Colon cancer Paternal Grandmother 64   Esophageal cancer Neg Hx    Stomach cancer Neg Hx    Rectal cancer Neg Hx     Social History   Socioeconomic History   Marital status: Divorced    Spouse name: Not on file   Number of children: 2   Years of education: Not on file   Highest education level: Not on file  Occupational History    Comment: [employer]; [employer] attorney  Tobacco Use   Smoking status: Every Day    Packs/day: 1.00    Years: 25.00    Total pack years: 25.00    Types: Cigarettes   Smokeless tobacco: Never  Vaping Use    Vaping Use: Never used  Substance and Sexual Activity   Alcohol use: Yes    Alcohol/week: 12.0 standard drinks of alcohol    Types: 12 Cans of beer per week    Comment: 5 days a week a glass of wine.   Drug use: No   Sexual activity: Not on file  Other Topics Concern   Not on file  Social History Narrative   Not on file   Social Determinants of Health   Financial Resource Strain: Not on file  Food Insecurity: Not on file  Transportation Needs: Not on file  Physical Activity: Not on file  Stress: Not on file  Social Connections: Not on file  Intimate Partner Violence: Not on file    Review of Systems:  All other review of systems negative except as mentioned in the HPI.  Physical Exam: Vital signs in last 24 hours: LMP 01/19/2015  General:   Alert, NAD Lungs:  Clear .   Heart:  Regular rate and rhythm Abdomen:  Soft, nontender and nondistended. Neuro/Psych:  Alert and cooperative. Normal mood and affect. A and O x 3  Reviewed labs, radiology imaging, old records and pertinent past GI work up  Patient is appropriate for planned procedure(s) and anesthesia in an ambulatory setting   K. Denzil Magnuson , MD [phone]

## 2021-08-15 NOTE — Patient Instructions (Signed)
Please read handouts provided. Continue present medications. Await pathology results.   YOU HAD AN ENDOSCOPIC PROCEDURE TODAY AT THE [facility]:   Refer to the procedure report that was given to you for any specific questions about what was found during the examination.  If the procedure report does not answer your questions, please call your gastroenterologist to clarify.  If you requested that your care partner not be given the details of your procedure findings, then the procedure report has been included in a sealed envelope for you to review at your convenience later.  YOU SHOULD EXPECT: Some feelings of bloating in the abdomen. Passage of more gas than usual.  Walking can help get rid of the air that was put into your GI tract during the procedure and reduce the bloating. If you had a lower endoscopy (such as a colonoscopy or flexible sigmoidoscopy) you may notice spotting of blood in your stool or on the toilet paper. If you underwent a bowel prep for your procedure, you may not have a normal bowel movement for a few days.  Please Note:  You might notice some irritation and congestion in your nose or some drainage.  This is from the oxygen used during your procedure.  There is no need for concern and it should clear up in a day or so.  SYMPTOMS TO REPORT IMMEDIATELY:  Following lower endoscopy (colonoscopy or flexible sigmoidoscopy):  Excessive amounts of blood in the stool  Significant tenderness or worsening of abdominal pains  Swelling of the abdomen that is new, acute  Fever of 100F or higher   For urgent or emergent issues, a gastroenterologist can be reached at any hour by calling [phone]. Do not use MyChart messaging for urgent concerns.    DIET:  We do recommend a small meal at first, but then you may proceed to your regular diet.  Drink plenty of fluids but you should avoid alcoholic beverages for 24 hours.  ACTIVITY:  You should plan to take it easy  for the rest of today and you should NOT DRIVE or use heavy machinery until tomorrow (because of the sedation medicines used during the test).    FOLLOW UP: Our staff will call the number listed on your records 24-72 hours following your procedure to check on you and address any questions or concerns that you may have regarding the information given to you following your procedure. If we do not reach you, we will leave a message.  We will attempt to reach you two times.  During this call, we will ask if you have developed any symptoms of COVID 19. If you develop any symptoms (ie: fever, flu-like symptoms, shortness of breath, cough etc.) before then, please call [phone].  If you test positive for Covid 19 in the 2 weeks post procedure, please call and report this information to us.    If any biopsies were taken you will be contacted by phone or by letter within the next 1-3 weeks.  Please call us at [phone] if you have not heard about the biopsies in 3 weeks.    SIGNATURES/CONFIDENTIALITY: You and/or your care partner have signed paperwork which will be entered into your electronic medical record.  These signatures attest to the fact that that the information above on your After Visit Summary has been reviewed and is understood.  Full responsibility of the confidentiality of this discharge information lies with you and/or your care-partner.  

## 2021-08-15 NOTE — Progress Notes (Unsigned)
Called to room to assist during endoscopic procedure.  Patient ID and intended procedure confirmed with present staff. Received instructions for my participation in the procedure from the performing physician.  

## 2021-08-15 NOTE — Progress Notes (Unsigned)
PT taken to PACU. Monitors in place. VSS. Report given to RN. 

## 2021-08-16 ENCOUNTER — Telehealth: Payer: Self-pay

## 2021-08-16 ENCOUNTER — Inpatient Hospital Stay: Admission: RE | Admit: 2021-08-16 | Payer: 59 | Source: Ambulatory Visit

## 2021-08-16 NOTE — Telephone Encounter (Signed)
  Follow up Call-     08/15/2021    3:15 PM  Call back number  Post procedure Call Back phone  # [phone]  Permission to leave phone message Yes     Patient questions:  Do you have a fever, pain , or abdominal swelling? No. Pain Score  0 *  Have you tolerated food without any problems? Yes.    Have you been able to return to your normal activities? Yes.    Do you have any questions about your discharge instructions: Diet   No. Medications  No. Follow up visit  No.  Do you have questions or concerns about your Care? No.  Actions: * If pain score is 4 or above: No action needed, pain <4.

## 2021-09-07 ENCOUNTER — Encounter: Payer: Self-pay | Admitting: Gastroenterology

## 2022-01-18 ENCOUNTER — Emergency Department (HOSPITAL_BASED_OUTPATIENT_CLINIC_OR_DEPARTMENT_OTHER)
Admission: EM | Admit: 2022-01-18 | Discharge: 2022-01-18 | Disposition: A | Discharge status: Home / Self Care | Payer: 59 | Attending: Emergency Medicine | Admitting: Emergency Medicine

## 2022-01-18 ENCOUNTER — Encounter (HOSPITAL_BASED_OUTPATIENT_CLINIC_OR_DEPARTMENT_OTHER): Payer: Self-pay | Admitting: Emergency Medicine

## 2022-01-18 ENCOUNTER — Other Ambulatory Visit: Payer: Self-pay

## 2022-01-18 ENCOUNTER — Emergency Department (HOSPITAL_BASED_OUTPATIENT_CLINIC_OR_DEPARTMENT_OTHER): Payer: 59

## 2022-01-18 DIAGNOSIS — S0003XA Contusion of scalp, initial encounter: Secondary | ICD-10-CM | POA: Insufficient documentation

## 2022-01-18 DIAGNOSIS — W01198A Fall on same level from slipping, tripping and stumbling with subsequent striking against other object, initial encounter: Secondary | ICD-10-CM | POA: Insufficient documentation

## 2022-01-18 DIAGNOSIS — S0990XA Unspecified injury of head, initial encounter: Secondary | ICD-10-CM | POA: Diagnosis present

## 2022-01-18 DIAGNOSIS — W19XXXA Unspecified fall, initial encounter: Secondary | ICD-10-CM

## 2022-01-18 MED ORDER — METHOCARBAMOL 500 MG PO TABS: 500.0000 mg | ORAL_TABLET | Freq: Two times a day (BID) | ORAL | 0 refills | Status: AC

## 2022-01-18 NOTE — ED Triage Notes (Signed)
Slipped and fell backwards. Hit her head on the floor. Denies LOC. C/o head pain.

## 2022-01-18 NOTE — Discharge Instructions (Signed)
As we discussed, your workup in the ER today was reassuring for acute findings.  CT imaging of your head did not reveal any emergent concerns.  I have given you a prescription for muscle relaxer for you to take as prescribed as needed for management of any residual soreness.  Please do not drive or operate heavy machinery while taking this medication as it can be sedating.  Please follow-up with your primary care doctor for any additional health needs.  Return if development of any new or worsening symptoms.

## 2022-01-18 NOTE — ED Provider Notes (Signed)
[facility] Provider Note   CSN: [id number] Arrival date & time: 01/18/22  1314     History  Chief Complaint  Patient presents with   Jill Castillo is a 51 y.o. female.  Patient with no pertinent past medical history presents today with complaints of a fall. She states that same occurred immediately prior to arrival today when she was standing on a step stool and the stool slipped out from under her and she fell and struck her head on concrete. She states that she did not loose consciousness and has not had any neck pain, vision changes, nausea, or vomiting. She is not anticoagulated. She endorses a headache and is concerned that the bruising to the back of her head is so large. Denies any other injuries or complaints.  The history is provided by the patient. No language interpreter was used.  Fall Associated symptoms include headaches.       Home Medications Prior to Admission medications   Medication Sig Start Date End Date Taking? Authorizing Provider  ALPRAZolam Duanne Moron) 0.5 MG tablet Take 0.5 mg by mouth 2 (two) times daily as needed.     [provider]  amphetamine-dextroamphetamine (ADDERALL) 20 MG tablet Take 1 tablet by mouth 3 (three) times daily. 12/05/14   [provider]  budesonide-formoterol (SYMBICORT) 160-4.5 MCG/ACT inhaler Inhale 2 puffs into the lungs daily.    [provider]  cyanocobalamin (,VITAMIN B-12,) 1000 MCG/ML injection Inject 1,000 mcg into the muscle. 3 times per month    [provider]  esomeprazole (NEXIUM) 40 MG capsule Take 40 mg by mouth daily at 12 noon.    [provider]  PROAIR RESPICLICK 834 (90 BASE) MCG/ACT AEPB Take 2 puffs by mouth as needed. 01/28/15   [provider]  progesterone (PROMETRIUM) 100 MG capsule Take 100 mg by mouth 3 (three) times daily.    [provider]      Allergies    Patient has no known allergies.    Review  of Systems   Review of Systems  Neurological:  Positive for headaches.  All other systems reviewed and are negative.   Physical Exam Updated Vital Signs BP 132/73   Pulse 76   Temp 98.3 F (36.8 C) (Oral)   Resp 18   Ht '5\' 5"'$  (1.651 m)   Wt 65.8 kg   LMP 01/19/2015   SpO2 100%   BMI 24.13 kg/m  Physical Exam Vitals and nursing note reviewed.  Constitutional:      General: She is not in acute distress.    Appearance: Normal appearance. She is normal weight. She is not ill-appearing, toxic-appearing or diaphoretic.  HENT:     Head: Normocephalic and atraumatic.     Comments: Hematoma noted to the posterior left parietal scalp without any crepitus or overlying wound.  Eyes:     Extraocular Movements: Extraocular movements intact.     Pupils: Pupils are equal, round, and reactive to light.  Neck:     Comments: No cervical spine tenderness to palpation Cardiovascular:     Rate and Rhythm: Normal rate.  Pulmonary:     Effort: Pulmonary effort is normal. No respiratory distress.  Abdominal:     General: Abdomen is flat.     Palpations: Abdomen is soft.  Musculoskeletal:        General: Normal range of motion.     Cervical back: Normal range of motion.     Comments:  Ambulatory with steady gait  No tenderness to palpation of thoracic or lumbar spine  Skin:    General: Skin is warm and dry.  Neurological:     General: No focal deficit present.     Mental Status: She is alert and oriented to person, place, and time.     Gait: Gait normal.  Psychiatric:        Mood and Affect: Mood normal.        Behavior: Behavior normal.     ED Results / Procedures / Treatments   Labs (all labs ordered are listed, but only abnormal results are displayed) Labs Reviewed - No data to display  EKG None  Radiology CT Head Wo Contrast  Result Date: 01/18/2022 CLINICAL DATA:  Provided history: Head trauma, moderate/severe. Additional history provided: Fall. EXAM: CT HEAD WITHOUT  CONTRAST TECHNIQUE: Contiguous axial images were obtained from the base of the skull through the vertex without intravenous contrast. RADIATION DOSE REDUCTION: This exam was performed according to the departmental dose-optimization program which includes automated exposure control, adjustment of the mA and/or kV according to patient size and/or use of iterative reconstruction technique. COMPARISON:  No pertinent prior exams available for comparison. FINDINGS: Brain: Cerebral volume is normal. There is no acute intracranial hemorrhage. No demarcated cortical infarct. No extra-axial fluid collection. No evidence of an intracranial mass. No midline shift. Vascular: No hyperdense vessel. Atherosclerotic calcifications. Skull: No fracture or aggressive osseous lesion. Sinuses/Orbits: No mass or acute finding within the imaged orbits. Mild mucosal thickening within the right maxillary sinus at the imaged levels. 12 mm mucous retention cyst within the left sphenoid sinus. Other: Left parietal scalp hematoma. IMPRESSION: No evidence of acute intracranial abnormality. Left parietal scalp hematoma. Paranasal sinus disease at the imaged levels, as described. Electronically Signed   By: Kellie Simmering D.O.   On: 01/18/2022 14:21    Procedures Procedures    Medications Ordered in ED Medications - No data to display  ED Course/ Medical Decision Making/ A&P                           Medical Decision Making Amount and/or Complexity of Data Reviewed Radiology: ordered.   Patient presents today with complaints of fall with head injury immediately prior to arrival today. She is afebrile, non-toxic appearing, and in no acute distress with reassuring vital signs. She is also alert and oriented and neurologically intact without focal deficits. Physical exam reveals hematoma to left parietal scalp, no other signs of trauma, no cervical spine tenderness to palpation. No LOC, no anticoagulation.   CT imaging of the head  reveals  No evidence of acute intracranial abnormality.   Left parietal scalp hematoma.   Paranasal sinus disease at the imaged levels, as described.  I have personally reviewed and interpreted this imaging and agree with radiology interpretation.  Offered pain medication to patient today which she declined. No further emergent concerns, she is stable for discharge. Will send with robaxin to help with sleep and any residual pain. Patient is understanding and amenable with plan, educated on red flag symptoms that would prompt immediate return. Patient discharged in stable condition.    Final Clinical Impression(s) / ED Diagnoses Final diagnoses:  Fall, initial encounter    Rx / DC Orders ED Discharge Orders          Ordered    methocarbamol (ROBAXIN) 500 MG tablet  2 times daily  01/18/22 1449          An After Visit Summary was printed and given to the patient.     Bud Face, PA-C 01/18/22 1451

## 2022-08-16 ENCOUNTER — Other Ambulatory Visit: Payer: Self-pay | Admitting: Internal Medicine

## 2022-08-16 DIAGNOSIS — Z720 Tobacco use: Secondary | ICD-10-CM
